# Patient Record
Sex: Female | Born: 1975 | Race: White | Hispanic: No | Marital: Married | Smoking: Former smoker
Health system: Southern US, Community
[De-identification: ages and names within clinical notes are randomized; demographics above are authoritative.]

## PROBLEM LIST (undated history)

## (undated) DIAGNOSIS — D649 Anemia, unspecified: Secondary | ICD-10-CM

## (undated) DIAGNOSIS — T8092XA Unspecified transfusion reaction, initial encounter: Secondary | ICD-10-CM

## (undated) DIAGNOSIS — O24319 Unspecified pre-existing diabetes mellitus in pregnancy, unspecified trimester: Secondary | ICD-10-CM

## (undated) DIAGNOSIS — E119 Type 2 diabetes mellitus without complications: Secondary | ICD-10-CM

## (undated) DIAGNOSIS — O24419 Gestational diabetes mellitus in pregnancy, unspecified control: Secondary | ICD-10-CM

## (undated) DIAGNOSIS — IMO0002 Reserved for concepts with insufficient information to code with codable children: Secondary | ICD-10-CM

## (undated) DIAGNOSIS — M5126 Other intervertebral disc displacement, lumbar region: Secondary | ICD-10-CM

## (undated) HISTORY — DX: Reserved for concepts with insufficient information to code with codable children: IMO0002

## (undated) HISTORY — DX: Anemia, unspecified: D64.9

## (undated) HISTORY — DX: Other intervertebral disc displacement, lumbar region: M51.26

## (undated) HISTORY — DX: Unspecified pre-existing diabetes mellitus in pregnancy, unspecified trimester: O24.319

## (undated) HISTORY — DX: Unspecified transfusion reaction, initial encounter: T80.92XA

## (undated) HISTORY — PX: LUMBAR DISC SURGERY: SHX700

## (undated) HISTORY — DX: Gestational diabetes mellitus in pregnancy, unspecified control: O24.419

## (undated) HISTORY — PX: DILATION AND CURETTAGE OF UTERUS: SHX78

---

## 2005-10-21 HISTORY — PX: LUMBAR LAMINECTOMY: SHX95

## 2010-04-09 ENCOUNTER — Ambulatory Visit: Payer: Self-pay | Admitting: Obstetrics & Gynecology

## 2010-05-18 ENCOUNTER — Other Ambulatory Visit: Admission: RE | Admit: 2010-05-18 | Discharge: 2010-05-18 | Payer: Self-pay | Admitting: Family

## 2010-05-18 ENCOUNTER — Ambulatory Visit: Payer: Self-pay | Admitting: Family

## 2010-05-18 LAB — CONVERTED CEMR LAB
HCT: 39.5 % (ref 36.0–46.0)
Hepatitis B Surface Ag: NEGATIVE
Lymphocytes Relative: 15 % (ref 12–46)
Lymphs Abs: 1.2 10*3/uL (ref 0.7–4.0)
Neutrophils Relative %: 79 % — ABNORMAL HIGH (ref 43–77)
Platelets: 178 10*3/uL (ref 150–400)
Rubella: 44.3 intl units/mL — ABNORMAL HIGH
WBC: 8.4 10*3/uL (ref 4.0–10.5)

## 2010-05-19 ENCOUNTER — Encounter: Payer: Self-pay | Admitting: Family

## 2010-05-23 ENCOUNTER — Ambulatory Visit (HOSPITAL_COMMUNITY): Admission: RE | Admit: 2010-05-23 | Discharge: 2010-05-23 | Payer: Self-pay | Admitting: Family Medicine

## 2010-05-30 ENCOUNTER — Encounter: Admission: RE | Admit: 2010-05-30 | Discharge: 2010-07-20 | Payer: Self-pay | Admitting: Obstetrics & Gynecology

## 2010-06-06 ENCOUNTER — Ambulatory Visit: Payer: Self-pay | Admitting: Obstetrics & Gynecology

## 2010-06-19 ENCOUNTER — Ambulatory Visit: Payer: Self-pay | Admitting: Obstetrics & Gynecology

## 2010-06-21 ENCOUNTER — Ambulatory Visit (HOSPITAL_COMMUNITY): Admission: RE | Admit: 2010-06-21 | Discharge: 2010-06-21 | Payer: Self-pay | Admitting: Obstetrics & Gynecology

## 2010-06-26 ENCOUNTER — Ambulatory Visit: Payer: Self-pay | Admitting: Obstetrics & Gynecology

## 2010-06-27 ENCOUNTER — Encounter: Payer: Self-pay | Admitting: Obstetrics & Gynecology

## 2010-06-27 LAB — CONVERTED CEMR LAB
ALT: 8 units/L (ref 0–35)
AST: 13 units/L (ref 0–37)
CO2: 20 meq/L (ref 19–32)
Creatinine 24 HR UR: 1889 mg/24hr — ABNORMAL HIGH (ref 700–1800)
Creatinine Clearance: 292 mL/min — ABNORMAL HIGH (ref 75–115)
Creatinine, Ser: 0.45 mg/dL (ref 0.40–1.20)
Creatinine, Urine: 94.5 mg/dL
HCT: 37.5 % (ref 36.0–46.0)
MCV: 90.8 fL (ref 78.0–100.0)
Platelets: 201 10*3/uL (ref 150–400)
RDW: 13.9 % (ref 11.5–15.5)
Total Bilirubin: 0.3 mg/dL (ref 0.3–1.2)
Uric Acid, Serum: 2.4 mg/dL (ref 2.4–7.0)

## 2010-07-02 ENCOUNTER — Ambulatory Visit: Payer: Self-pay | Admitting: Advanced Practice Midwife

## 2010-07-11 ENCOUNTER — Ambulatory Visit: Payer: Self-pay | Admitting: Obstetrics & Gynecology

## 2010-07-23 ENCOUNTER — Ambulatory Visit: Payer: Self-pay | Admitting: Physician Assistant

## 2010-08-02 ENCOUNTER — Ambulatory Visit (HOSPITAL_COMMUNITY): Admission: RE | Admit: 2010-08-02 | Discharge: 2010-08-02 | Payer: Self-pay | Admitting: Obstetrics & Gynecology

## 2010-08-20 ENCOUNTER — Ambulatory Visit: Payer: Self-pay | Admitting: Obstetrics & Gynecology

## 2010-08-23 ENCOUNTER — Ambulatory Visit: Payer: Self-pay | Admitting: Obstetrics & Gynecology

## 2010-08-27 ENCOUNTER — Ambulatory Visit: Payer: Self-pay | Admitting: Obstetrics and Gynecology

## 2010-08-28 ENCOUNTER — Ambulatory Visit: Payer: Self-pay | Admitting: Obstetrics & Gynecology

## 2010-08-30 ENCOUNTER — Ambulatory Visit (HOSPITAL_COMMUNITY): Admission: RE | Admit: 2010-08-30 | Discharge: 2010-08-30 | Payer: Self-pay | Admitting: Obstetrics & Gynecology

## 2010-09-03 ENCOUNTER — Ambulatory Visit: Payer: Self-pay | Admitting: Obstetrics and Gynecology

## 2010-09-05 ENCOUNTER — Ambulatory Visit: Payer: Self-pay | Admitting: Obstetrics & Gynecology

## 2010-09-11 ENCOUNTER — Ambulatory Visit: Payer: Self-pay | Admitting: Obstetrics & Gynecology

## 2010-09-16 ENCOUNTER — Inpatient Hospital Stay (HOSPITAL_COMMUNITY)
Admission: AD | Admit: 2010-09-16 | Discharge: 2010-09-17 | Payer: Self-pay | Source: Home / Self Care | Admitting: Obstetrics & Gynecology

## 2010-09-21 ENCOUNTER — Ambulatory Visit: Payer: Self-pay | Admitting: Obstetrics and Gynecology

## 2010-09-21 LAB — CONVERTED CEMR LAB: GC Probe Amp, Urine: NEGATIVE

## 2010-09-22 ENCOUNTER — Encounter: Payer: Self-pay | Admitting: Obstetrics and Gynecology

## 2010-09-25 ENCOUNTER — Ambulatory Visit: Payer: Self-pay | Admitting: Obstetrics & Gynecology

## 2010-09-26 ENCOUNTER — Encounter: Payer: Self-pay | Admitting: Obstetrics & Gynecology

## 2010-09-26 LAB — CONVERTED CEMR LAB
ALT: 20 units/L (ref 0–53)
AST: 16 units/L (ref 0–37)
Alkaline Phosphatase: 108 units/L (ref 39–117)
Calcium: 8.4 mg/dL (ref 8.4–10.5)
Chloride: 108 meq/L (ref 96–112)
Creatinine, Ser: 0.48 mg/dL (ref 0.40–1.50)
HCT: 37.2 % — ABNORMAL LOW (ref 39.0–52.0)
MCHC: 31.2 g/dL (ref 30.0–36.0)
Platelets: 194 10*3/uL (ref 150–400)
RDW: 15.4 % (ref 11.5–15.5)
Total Bilirubin: 0.2 mg/dL — ABNORMAL LOW (ref 0.3–1.2)

## 2010-09-27 ENCOUNTER — Ambulatory Visit (HOSPITAL_COMMUNITY)
Admission: RE | Admit: 2010-09-27 | Discharge: 2010-09-27 | Payer: Self-pay | Source: Home / Self Care | Attending: Obstetrics & Gynecology | Admitting: Obstetrics & Gynecology

## 2010-09-28 ENCOUNTER — Encounter: Payer: Self-pay | Admitting: Obstetrics & Gynecology

## 2010-09-28 ENCOUNTER — Ambulatory Visit: Payer: Self-pay | Admitting: Obstetrics and Gynecology

## 2010-09-28 LAB — CONVERTED CEMR LAB
Collection Interval-CRCL: 24 hr
Creatinine 24 HR UR: 1516 mg/24hr (ref 700–1800)
Creatinine Clearance: 199 mL/min — ABNORMAL HIGH (ref 75–115)
Creatinine, Urine: 46 mg/dL

## 2010-10-02 ENCOUNTER — Ambulatory Visit: Payer: Self-pay | Admitting: Obstetrics & Gynecology

## 2010-10-05 ENCOUNTER — Ambulatory Visit (HOSPITAL_COMMUNITY)
Admission: RE | Admit: 2010-10-05 | Discharge: 2010-10-05 | Payer: Self-pay | Source: Home / Self Care | Attending: Obstetrics & Gynecology | Admitting: Obstetrics & Gynecology

## 2010-10-09 ENCOUNTER — Ambulatory Visit: Payer: Self-pay | Admitting: Obstetrics & Gynecology

## 2010-10-09 ENCOUNTER — Inpatient Hospital Stay (HOSPITAL_COMMUNITY)
Admission: AD | Admit: 2010-10-09 | Discharge: 2010-10-09 | Payer: Self-pay | Source: Home / Self Care | Attending: Obstetrics & Gynecology | Admitting: Obstetrics & Gynecology

## 2010-10-11 ENCOUNTER — Inpatient Hospital Stay (HOSPITAL_COMMUNITY)
Admission: AD | Admit: 2010-10-11 | Discharge: 2010-10-13 | Payer: Self-pay | Source: Home / Self Care | Attending: Obstetrics & Gynecology | Admitting: Obstetrics & Gynecology

## 2010-11-10 ENCOUNTER — Encounter: Payer: Self-pay | Admitting: Obstetrics & Gynecology

## 2010-12-04 ENCOUNTER — Ambulatory Visit: Payer: Medicaid Other | Admitting: Obstetrics & Gynecology

## 2010-12-04 DIAGNOSIS — O99815 Abnormal glucose complicating the puerperium: Secondary | ICD-10-CM

## 2010-12-18 ENCOUNTER — Encounter: Payer: Self-pay | Admitting: Obstetrics & Gynecology

## 2010-12-18 LAB — CONVERTED CEMR LAB: Glucose, Fasting: 114 mg/dL — ABNORMAL HIGH (ref 70–99)

## 2010-12-31 LAB — COMPREHENSIVE METABOLIC PANEL
ALT: 17 U/L (ref 0–35)
ALT: 20 U/L (ref 0–35)
AST: 18 U/L (ref 0–37)
Albumin: 2.6 g/dL — ABNORMAL LOW (ref 3.5–5.2)
Alkaline Phosphatase: 93 U/L (ref 39–117)
CO2: 21 mEq/L (ref 19–32)
Calcium: 9.7 mg/dL (ref 8.4–10.5)
Chloride: 102 mEq/L (ref 96–112)
Creatinine, Ser: 0.62 mg/dL (ref 0.4–1.2)
GFR calc Af Amer: >60 mL/min (ref >60)
GFR calc Af Amer: >60 mL/min (ref >60)
GFR calc non Af Amer: >60 mL/min (ref >60)
Glucose, Bld: 68 mg/dL — ABNORMAL LOW (ref 70–99)
Potassium: 3.8 mEq/L (ref 3.5–5.1)
Sodium: 133 mEq/L — ABNORMAL LOW (ref 135–145)
Sodium: 135 mEq/L (ref 135–145)
Total Bilirubin: 0.5 mg/dL (ref 0.3–1.2)
Total Protein: 6 g/dL (ref 6.0–8.3)
Total Protein: 6.6 g/dL (ref 6.0–8.3)

## 2010-12-31 LAB — CBC
HCT: 35.4 % — ABNORMAL LOW (ref 36.0–46.0)
Hemoglobin: 11.7 g/dL — ABNORMAL LOW (ref 12.0–15.0)
Hemoglobin: 12 g/dL (ref 12.0–15.0)
MCH: 28.3 pg (ref 26.0–34.0)
MCHC: 32.6 g/dL (ref 30.0–36.0)
MCV: 86.8 fL (ref 78.0–100.0)
RDW: 15.1 % (ref 11.5–15.5)
WBC: 10.3 10*3/uL (ref 4.0–10.5)

## 2010-12-31 LAB — URINALYSIS, ROUTINE W REFLEX MICROSCOPIC
Bilirubin Urine: NEGATIVE
Glucose, UA: NEGATIVE mg/dL
Ketones, ur: NEGATIVE mg/dL
Protein, ur: NEGATIVE mg/dL
pH: 5.5 (ref 5.0–8.0)

## 2010-12-31 LAB — RPR: RPR Ser Ql: NONREACTIVE

## 2010-12-31 LAB — GLUCOSE, CAPILLARY
Glucose-Capillary: 63 mg/dL — ABNORMAL LOW (ref 70–99)
Glucose-Capillary: 93 mg/dL (ref 70–99)

## 2010-12-31 LAB — PROTEIN / CREATININE RATIO, URINE
Creatinine, Urine: 40.8 mg/dL
Total Protein, Urine: <6 mg/dL

## 2010-12-31 LAB — URINE MICROSCOPIC-ADD ON

## 2011-07-29 LAB — OB RESULTS CONSOLE GC/CHLAMYDIA: Chlamydia: NEGATIVE

## 2011-09-17 ENCOUNTER — Ambulatory Visit (INDEPENDENT_AMBULATORY_CARE_PROVIDER_SITE_OTHER): Payer: Medicaid Other | Admitting: Obstetrics & Gynecology

## 2011-09-17 ENCOUNTER — Encounter: Payer: Self-pay | Admitting: Obstetrics & Gynecology

## 2011-09-17 ENCOUNTER — Other Ambulatory Visit: Payer: Self-pay | Admitting: Obstetrics & Gynecology

## 2011-09-17 VITALS — BP 118/80 | Temp 97.3°F | Ht 66.0 in | Wt 244.0 lb

## 2011-09-17 DIAGNOSIS — Z3480 Encounter for supervision of other normal pregnancy, unspecified trimester: Secondary | ICD-10-CM

## 2011-09-17 DIAGNOSIS — O24419 Gestational diabetes mellitus in pregnancy, unspecified control: Secondary | ICD-10-CM

## 2011-09-17 DIAGNOSIS — O99810 Abnormal glucose complicating pregnancy: Secondary | ICD-10-CM

## 2011-09-17 DIAGNOSIS — Z3689 Encounter for other specified antenatal screening: Secondary | ICD-10-CM

## 2011-09-17 NOTE — Progress Notes (Signed)
She is here for a NOB. Her visit was uncomplicated. I advised her about weight gain.

## 2011-09-17 NOTE — Patient Instructions (Signed)
Pregnancy - First Trimester During sexual intercourse, millions of sperm go into the vagina. Only 1 sperm will penetrate and fertilize the female egg while it is in the Fallopian tube. One week later, the fertilized egg implants into the wall of the uterus. An embryo begins to develop into a baby. At 6 to 8 weeks, the eyes and face are formed and the heartbeat can be seen on ultrasound. At the end of 12 weeks (first trimester), all the baby's organs are formed. Now that you are pregnant, you will want to do everything you can to have a healthy baby. Two of the most important things are to get good prenatal care and follow your caregiver's instructions. Prenatal care is all the medical care you receive before the baby's birth. It is given to prevent, find, and treat problems during the pregnancy and childbirth. PRENATAL EXAMS  During prenatal visits, your weight, blood pressure and urine are checked. This is done to make sure you are healthy and progressing normally during the pregnancy.   A pregnant woman should gain 25 to 35 pounds during the pregnancy. However, if you are over weight or underweight, your caregiver will advise you regarding your weight.   Your caregiver will ask and answer questions for you.   Blood work, cervical cultures, other necessary tests and a Pap test are done during your prenatal exams. These tests are done to check on your health and the probable health of your baby. Tests are strongly recommended and done for HIV with your permission. This is the virus that causes AIDS. These tests are done because medications can be given to help prevent your baby from being born with this infection should you have been infected without knowing it. Blood work is also used to find out your blood type, previous infections and follow your blood levels (hemoglobin).   Low hemoglobin (anemia) is common during pregnancy. Iron and vitamins are given to help prevent this. Later in the pregnancy,  blood tests for diabetes will be done along with any other tests if any problems develop. You may need tests to make sure you and the baby are doing well.   You may need other tests to make sure you and the baby are doing well.  CHANGES DURING THE FIRST TRIMESTER (THE FIRST 3 MONTHS OF PREGNANCY) Your body goes through many changes during pregnancy. They vary from person to person. Talk to your caregiver about changes you notice and are concerned about. Changes can include:  Your menstrual period stops.   The egg and sperm carry the genes that determine what you look like. Genes from you and your partner are forming a baby. The female genes determine whether the baby is a boy or a girl.   Your body increases in girth and you may feel bloated.   Feeling sick to your stomach (nauseous) and throwing up (vomiting). If the vomiting is uncontrollable, call your caregiver.   Your breasts will begin to enlarge and become tender.   Your nipples may stick out more and become darker.   The need to urinate more. Painful urination may mean you have a bladder infection.   Tiring easily.   Loss of appetite.   Cravings for certain kinds of food.   At first, you may gain or lose a couple of pounds.   You may have changes in your emotions from day to day (excited to be pregnant or concerned something may go wrong with the pregnancy and baby).     You may have more vivid and strange dreams.  HOME CARE INSTRUCTIONS   It is very important to avoid all smoking, alcohol and un-prescribed drugs during your pregnancy. These affect the formation and growth of the baby. Avoid chemicals while pregnant to ensure the delivery of a healthy infant.   Start your prenatal visits by the 12th week of pregnancy. They are usually scheduled monthly at first, then more often in the last 2 months before delivery. Keep your caregiver's appointments. Follow your caregiver's instructions regarding medication use, blood and lab  tests, exercise, and diet.   During pregnancy, you are providing food for you and your baby. Eat regular, well-balanced meals. Choose foods such as meat, fish, milk and other low fat dairy products, vegetables, fruits, and whole-grain breads and cereals. Your caregiver will tell you of the ideal weight gain.   You can help morning sickness by keeping soda crackers at the bedside. Eat a couple before arising in the morning. You may want to use the crackers without salt on them.   Eating 4 to 5 small meals rather than 3 large meals a day also may help the nausea and vomiting.   Drinking liquids between meals instead of during meals also seems to help nausea and vomiting.   A physical sexual relationship may be continued throughout pregnancy if there are no other problems. Problems may be early (premature) leaking of amniotic fluid from the membranes, vaginal bleeding, or belly (abdominal) pain.   Exercise regularly if there are no restrictions. Check with your caregiver or physical therapist if you are unsure of the safety of some of your exercises. Greater weight gain will occur in the last 2 trimesters of pregnancy. Exercising will help:   Control your weight.   Keep you in shape.   Prepare you for labor and delivery.   Help you lose your pregnancy weight after you deliver your baby.   Wear a good support or jogging bra for breast tenderness during pregnancy. This may help if worn during sleep too.   Ask when prenatal classes are available. Begin classes when they are offered.   Do not use hot tubs, steam rooms or saunas.   Wear your seat belt when driving. This protects you and your baby if you are in an accident.   Avoid raw meat, uncooked cheese, cat litter boxes and soil used by cats throughout the pregnancy. These carry germs that can cause birth defects in the baby.   The first trimester is a good time to visit your dentist for your dental health. Getting your teeth cleaned is  OK. Use a softer toothbrush and brush gently during pregnancy.   Ask for help if you have financial, counseling or nutritional needs during pregnancy. Your caregiver will be able to offer counseling for these needs as well as refer you for other special needs.   Do not take any medications or herbs unless told by your caregiver.   Inform your caregiver if there is any mental or physical domestic violence.   Make a list of emergency phone numbers of family, friends, hospital, and police and fire departments.   Write down your questions. Take them to your prenatal visit.   Do not douche.   Do not cross your legs.   If you have to stand for long periods of time, rotate you feet or take small steps in a circle.   You may have more vaginal secretions that may require a sanitary pad. Do not use   tampons or scented sanitary pads.  MEDICATIONS AND DRUG USE IN PREGNANCY  Take prenatal vitamins as directed. The vitamin should contain 1 milligram of folic acid. Keep all vitamins out of reach of children. Only a couple vitamins or tablets containing iron may be fatal to a baby or young child when ingested.   Avoid use of all medications, including herbs, over-the-counter medications, not prescribed or suggested by your caregiver. Only take over-the-counter or prescription medicines for pain, discomfort, or fever as directed by your caregiver. Do not use aspirin, ibuprofen, or naproxen unless directed by your caregiver.   Let your caregiver also know about herbs you may be using.   Alcohol is related to a number of birth defects. This includes fetal alcohol syndrome. All alcohol, in any form, should be avoided completely. Smoking will cause low birth rate and premature babies.   Street or illegal drugs are very harmful to the baby. They are absolutely forbidden. A baby born to an addicted mother will be addicted at birth. The baby will go through the same withdrawal an adult does.   Let your  caregiver know about any medications that you have to take and for what reason you take them.  MISCARRIAGE IS COMMON DURING PREGNANCY A miscarriage does not mean you did something wrong. It is not a reason to worry about getting pregnant again. Your caregiver will help you with questions you may have. If you have a miscarriage, you may need minor surgery. SEEK MEDICAL CARE IF:  You have any concerns or worries during your pregnancy. It is better to call with your questions if you feel they cannot wait, rather than worry about them. SEEK IMMEDIATE MEDICAL CARE IF:   An unexplained oral temperature above 102 F (38.9 C) develops, or as your caregiver suggests.   You have leaking of fluid from the vagina (birth canal). If leaking membranes are suspected, take your temperature and inform your caregiver of this when you call.   There is vaginal spotting or bleeding. Notify your caregiver of the amount and how many pads are used.   You develop a bad smelling vaginal discharge with a change in the color.   You continue to feel sick to your stomach (nauseated) and have no relief from remedies suggested. You vomit blood or coffee ground-like materials.   You lose more than 2 pounds of weight in 1 week.   You gain more than 2 pounds of weight in 1 week and you notice swelling of your face, hands, feet, or legs.   You gain 5 pounds or more in 1 week (even if you do not have swelling of your hands, face, legs, or feet).   You get exposed to German measles and have never had them.   You are exposed to fifth disease or chickenpox.   You develop belly (abdominal) pain. Round ligament discomfort is a common non-cancerous (benign) cause of abdominal pain in pregnancy. Your caregiver still must evaluate this.   You develop headache, fever, diarrhea, pain with urination, or shortness of breath.   You fall or are in a car accident or have any kind of trauma.   There is mental or physical violence in  your home.  Document Released: 10/01/2001 Document Revised: 06/19/2011 Document Reviewed: 04/04/2009 [employer] Patient Information 2012 [employer]. 

## 2011-09-19 ENCOUNTER — Telehealth: Payer: Self-pay | Admitting: *Deleted

## 2011-09-19 LAB — HIV ANTIBODY (ROUTINE TESTING W REFLEX): HIV: NONREACTIVE

## 2011-09-19 LAB — OBSTETRIC PANEL
Antibody Screen: NEGATIVE
Basophils Absolute: 0 10*3/uL (ref 0.0–0.1)
Eosinophils Relative: 2 % (ref 0–5)
HCT: 40.1 % (ref 36.0–46.0)
Hemoglobin: 13.3 g/dL (ref 12.0–15.0)
Lymphocytes Relative: 15 % (ref 12–46)
MCHC: 33.2 g/dL (ref 30.0–36.0)
MCV: 91.8 fL (ref 78.0–100.0)
Monocytes Absolute: 0.4 10*3/uL (ref 0.1–1.0)
Monocytes Relative: 5 % (ref 3–12)
Neutro Abs: 7.7 10*3/uL (ref 1.7–7.7)
RDW: 13.5 % (ref 11.5–15.5)
Rh Type: POSITIVE
Rubella: 56.2 IU/mL — ABNORMAL HIGH
WBC: 9.8 10*3/uL (ref 4.0–10.5)

## 2011-09-19 LAB — GC/CHLAMYDIA PROBE AMP, URINE: GC Probe Amp, Urine: NEGATIVE

## 2011-09-19 LAB — GLUCOSE TOLERANCE, 3 HOURS
Glucose Tolerance, Fasting: 165 mg/dL — ABNORMAL HIGH (ref 70–104)
Glucose, GTT - 3 Hour: 195 mg/dL — ABNORMAL HIGH (ref 70–144)

## 2011-09-19 NOTE — Telephone Encounter (Signed)
Pt notified of elevated 3 hr GTT.  Spoke with Dr Marice Potter who wanted pt seen by MD next week to initiate insulin.

## 2011-09-20 LAB — URINE CULTURE: Colony Count: 15000

## 2011-09-25 ENCOUNTER — Ambulatory Visit (HOSPITAL_COMMUNITY)
Admission: RE | Admit: 2011-09-25 | Discharge: 2011-09-25 | Disposition: A | Discharge status: Home / Self Care | Payer: Medicaid Other | Source: Ambulatory Visit | Attending: Obstetrics & Gynecology | Admitting: Obstetrics & Gynecology

## 2011-09-25 ENCOUNTER — Other Ambulatory Visit: Payer: Self-pay | Admitting: Obstetrics & Gynecology

## 2011-09-25 ENCOUNTER — Ambulatory Visit (INDEPENDENT_AMBULATORY_CARE_PROVIDER_SITE_OTHER): Payer: Medicaid Other | Admitting: Obstetrics & Gynecology

## 2011-09-25 VITALS — BP 143/73 | Temp 97.3°F | Wt 242.0 lb

## 2011-09-25 DIAGNOSIS — O0990 Supervision of high risk pregnancy, unspecified, unspecified trimester: Secondary | ICD-10-CM | POA: Insufficient documentation

## 2011-09-25 DIAGNOSIS — Z3480 Encounter for supervision of other normal pregnancy, unspecified trimester: Secondary | ICD-10-CM

## 2011-09-25 DIAGNOSIS — O99810 Abnormal glucose complicating pregnancy: Secondary | ICD-10-CM | POA: Insufficient documentation

## 2011-09-25 DIAGNOSIS — O09299 Supervision of pregnancy with other poor reproductive or obstetric history, unspecified trimester: Secondary | ICD-10-CM | POA: Insufficient documentation

## 2011-09-25 DIAGNOSIS — O09529 Supervision of elderly multigravida, unspecified trimester: Secondary | ICD-10-CM | POA: Insufficient documentation

## 2011-09-25 DIAGNOSIS — O24419 Gestational diabetes mellitus in pregnancy, unspecified control: Secondary | ICD-10-CM

## 2011-09-25 DIAGNOSIS — Z3689 Encounter for other specified antenatal screening: Secondary | ICD-10-CM

## 2011-09-25 DIAGNOSIS — J45909 Unspecified asthma, uncomplicated: Secondary | ICD-10-CM | POA: Insufficient documentation

## 2011-09-25 MED ORDER — GLYBURIDE 5 MG PO TABS: 5.0000 mg | ORAL_TABLET | Freq: Every day | ORAL | Status: DC

## 2011-09-25 NOTE — Progress Notes (Signed)
Fasting 164, 166;  2 hr pp 219,  2 hr lunch 108,  2 hr dinner 181.  Will start glyburide 5 mg bid.  Pt will call with CBGs on Monday.  24 hour urine and baseline CMP.  Need dating [place] ASAP.  Pt familiar with diabetic diet and will follow it.

## 2011-09-25 NOTE — Progress Notes (Signed)
P = 91 

## 2011-09-25 NOTE — Progress Notes (Signed)
Urine- moderate ketones

## 2011-09-26 LAB — COMPREHENSIVE METABOLIC PANEL
ALT: 19 U/L (ref 0–35)
CO2: 18 mEq/L — ABNORMAL LOW (ref 19–32)
Calcium: 9 mg/dL (ref 8.4–10.5)
Chloride: 103 mEq/L (ref 96–112)
Creat: 0.5 mg/dL (ref 0.50–1.10)
Glucose, Bld: 123 mg/dL — ABNORMAL HIGH (ref 70–99)
Total Protein: 6.3 g/dL (ref 6.0–8.3)

## 2011-09-30 ENCOUNTER — Telehealth: Payer: Self-pay | Admitting: Obstetrics & Gynecology

## 2011-09-30 ENCOUNTER — Telehealth: Payer: Self-pay | Admitting: *Deleted

## 2011-09-30 MED ORDER — INSULIN REGULAR HUMAN 100 UNIT/ML IJ SOLN: 19.0000 [IU] | Freq: Every day | INTRAMUSCULAR | Status: DC

## 2011-09-30 MED ORDER — INSULIN REGULAR HUMAN 100 UNIT/ML IJ SOLN: 14.0000 [IU] | Freq: Every day | INTRAMUSCULAR | Status: DC

## 2011-09-30 MED ORDER — SYRINGE (DISPOSABLE) 1 ML MISC: 1.0000 | Freq: Three times a day (TID) | Status: DC

## 2011-09-30 MED ORDER — INSULIN NPH (HUMAN) (ISOPHANE) 100 UNIT/ML ~~LOC~~ SUSP: 14.0000 [IU] | Freq: Every day | SUBCUTANEOUS | Status: DC

## 2011-09-30 MED ORDER — INSULIN NPH (HUMAN) (ISOPHANE) 100 UNIT/ML ~~LOC~~ SUSP: 28.0000 [IU] | Freq: Every day | SUBCUTANEOUS | Status: DC

## 2011-09-30 NOTE — Telephone Encounter (Signed)
3:30pm Pt called and given instructions on starting her insulin.  Once pt has once dose of insulin she may stop Glyburide per Dr. Penne Lash.  Pt to call and give report of CBG on Thursday.  Pt decline genetic screening of AFP.  Pt also states that she is aggravated because she wanted to just add Metformin to her current RX of Glyburide. 4:15 Addendum to previous above note.  Pt called back and said that her Medicaid is not active yet and she cannot afford her Insulin.  Spoke with Dr. Penne Lash and pt is to continue Glyburide and start Metformin 100mg  BID.  I will talk with Arnetha Courser  about financial assist to help pt with medication.  Pt was made aware that this glucose does need to get under control and she must be compliant because she is at high risk for a fetal death due to hyperglycemia.  Pt voices understanding.

## 2011-09-30 NOTE — Telephone Encounter (Signed)
Glyburide not controlling hyperglycemia.  Will change to weight based insulin.  Pt is 109 kg, will start at 80% NPH qam = 28 units Regular qam  = 19 units Regular before dinner = 14 units NPH QHS = 14 units  Pt to come in Wednesday with CBGs or can call with CBGs . If pt calls, then need to offer genetic screening.  Dating [place] put her at 17 weeks last week.

## 2011-10-01 ENCOUNTER — Ambulatory Visit (HOSPITAL_COMMUNITY): Payer: Medicaid Other

## 2011-10-03 ENCOUNTER — Telehealth: Payer: Self-pay | Admitting: *Deleted

## 2011-10-03 NOTE — Telephone Encounter (Signed)
Pt called with her CBG levels for 12/11-this AM. 12/11 F-148  PC BKFT 171   Lunch 105    Dinner 136  12/12 F 139                  129              111                141  12/13 F 125  Pt also found some Humulin N and Reg insulin in her refrigerator that has not expired and she is going to start her insulin tomorrow.

## 2011-10-04 ENCOUNTER — Other Ambulatory Visit (INDEPENDENT_AMBULATORY_CARE_PROVIDER_SITE_OTHER): Payer: Medicaid Other

## 2011-10-04 DIAGNOSIS — O24919 Unspecified diabetes mellitus in pregnancy, unspecified trimester: Secondary | ICD-10-CM

## 2011-10-04 LAB — CBC
HCT: 38.9 % (ref 36.0–46.0)
MCHC: 34.2 g/dL (ref 30.0–36.0)
Platelets: 182 10*3/uL (ref 150–400)
RDW: 13.6 % (ref 11.5–15.5)
WBC: 11.1 10*3/uL — ABNORMAL HIGH (ref 4.0–10.5)

## 2011-10-05 LAB — COMPREHENSIVE METABOLIC PANEL
ALT: 21 U/L (ref 0–35)
AST: 29 U/L (ref 0–37)
Albumin: 3.3 g/dL — ABNORMAL LOW (ref 3.5–5.2)
CO2: 20 mEq/L (ref 19–32)
Calcium: 8.9 mg/dL (ref 8.4–10.5)
Chloride: 104 mEq/L (ref 96–112)
Potassium: 4 mEq/L (ref 3.5–5.3)
Total Protein: 5.6 g/dL — ABNORMAL LOW (ref 6.0–8.3)

## 2011-10-05 LAB — PROTEIN, URINE, 24 HOUR: Protein, 24H Urine: 120 mg/d — ABNORMAL HIGH (ref 50–100)

## 2011-10-05 LAB — CREATININE CLEARANCE, URINE, 24 HOUR: Creatinine Clearance: 262 mL/min — ABNORMAL HIGH (ref 75–115)

## 2011-10-07 ENCOUNTER — Telehealth: Payer: Self-pay | Admitting: *Deleted

## 2011-10-07 NOTE — Telephone Encounter (Signed)
Pt called stating she had a toothache and wanted to see what else she could use for pain.  States her Medicaid hasn't come through and she doesn't have a dentist.  Spoke with Dr. Debroah Loop and he said that she needed to be seen by a dentist that he could not just call in for meds without being seen.  Notified pt to call the emergency dentist.

## 2011-10-22 NOTE — L&D Delivery Note (Signed)
Operative Delivery Note At 4:00 PM a viable female was delivered via Vaginal, Vacuum (Extractor) (Presentation: Left Occiput Anterior) due to terminal bradycardia.  The patient was examined and found to be Presentation: vertex; Position: Left,, Occiput,, Anterior; Station: +3.  Verbal consent: obtained from patient.  Risks and benefits discussed in detail.  Risks include, but are not limited to the risks of anesthesia, bleeding, infection, damage to maternal tissues, fetal cephalhematoma.  There is also the risk of inability to effect vaginal delivery of the head, or shoulder dystocia that cannot be resolved by established maneuvers, leading to the need for emergency cesarean section.  The Kiwi Omnicup was positioned over the sagittal suture 3 cm anterior to posterior fontanelle.  Pressure was then increased to 500 mmHg, and the patient was instructed to push.  Pulling was administered along the pelvic curve.  1 pull was administered during 1 contractions, with release of pressure between contractions.  No popoffs.  The infant was then delivered atraumatically.  Sponge, instrument and needle counts were correct x2.  APGAR: 9, 9; weight 7 lb 5.8 oz (3340 g).   Placenta status: Intact, Spontaneous.  Cord: 3 vessels with the following complications: Knot.  Anesthesia: Epidural  Episiotomy: None Lacerations: None Est. Blood Loss (mL): 300  Mom to postpartum.  Baby to nursery-stable.  Ahni Bradwell JEHIEL 02/26/2012, 5:18 PM

## 2011-11-08 ENCOUNTER — Encounter: Payer: Self-pay | Admitting: Family

## 2011-11-08 ENCOUNTER — Ambulatory Visit (INDEPENDENT_AMBULATORY_CARE_PROVIDER_SITE_OTHER): Payer: Self-pay | Admitting: Family

## 2011-11-08 VITALS — BP 118/70 | Temp 98.2°F | Wt 242.0 lb

## 2011-11-08 DIAGNOSIS — O24419 Gestational diabetes mellitus in pregnancy, unspecified control: Secondary | ICD-10-CM

## 2011-11-08 DIAGNOSIS — Z3480 Encounter for supervision of other normal pregnancy, unspecified trimester: Secondary | ICD-10-CM

## 2011-11-08 DIAGNOSIS — O99810 Abnormal glucose complicating pregnancy: Secondary | ICD-10-CM

## 2011-11-08 LAB — OB RESULTS CONSOLE RPR: RPR: NONREACTIVE

## 2011-11-08 LAB — OB RESULTS CONSOLE HIV ANTIBODY (ROUTINE TESTING): HIV: NONREACTIVE

## 2011-11-08 MED ORDER — GLUCOSE BLOOD VI STRP: ORAL_STRIP | Status: DC

## 2011-11-08 NOTE — Progress Notes (Signed)
Ran out of insulin and has stopped carbs because she doesn't have insurance coverage for her meds.  CBG 162 1 hr post bkfst  Ketones 80

## 2011-11-08 NOTE — Progress Notes (Signed)
Pt here with reports of only taking Metformin 1000mg  BID; states that she was using her old insulin 4 weeks ago, but ran out.  Does not have CBG log with her today, states "everything has been normal"; +fetal movement.  Also noted that some prenatal labs and pap/GC/CT is missing; pt would like to defer pap w/GC/CT for next visit.  Obtain HIV, RPR, CBC today. Schedule follow-up growth [place] and schedule fetal echo.  Consulted with Dr. Debroah Loop who agrees with plan.  Pt will return in one week with CBG log and adjustments will be made at that time.  Emphasized importance of documentation of blood sugars and bringing them to appointment for health of baby.  Pt at conclusion of appointment states her Medicaid is not complete and can't afford strips; will [facility].

## 2011-11-09 LAB — CBC
Hemoglobin: 13 g/dL (ref 12.0–15.0)
MCH: 30.2 pg (ref 26.0–34.0)
Platelets: 183 10*3/uL (ref 150–400)
RBC: 4.31 MIL/uL (ref 3.87–5.11)
WBC: 9.7 10*3/uL (ref 4.0–10.5)

## 2011-11-09 LAB — RPR

## 2011-11-09 LAB — HIV ANTIBODY (ROUTINE TESTING W REFLEX): HIV: NONREACTIVE

## 2011-11-15 ENCOUNTER — Encounter: Payer: Self-pay | Admitting: Family

## 2011-11-27 ENCOUNTER — Other Ambulatory Visit: Payer: Self-pay | Admitting: Obstetrics & Gynecology

## 2011-11-29 ENCOUNTER — Ambulatory Visit (HOSPITAL_COMMUNITY)
Admission: RE | Admit: 2011-11-29 | Discharge: 2011-11-29 | Disposition: A | Discharge status: Home / Self Care | Payer: Medicaid Other | Source: Ambulatory Visit | Attending: Family | Admitting: Family

## 2011-11-29 DIAGNOSIS — O09299 Supervision of pregnancy with other poor reproductive or obstetric history, unspecified trimester: Secondary | ICD-10-CM | POA: Insufficient documentation

## 2011-11-29 DIAGNOSIS — O24419 Gestational diabetes mellitus in pregnancy, unspecified control: Secondary | ICD-10-CM

## 2011-11-29 DIAGNOSIS — O09529 Supervision of elderly multigravida, unspecified trimester: Secondary | ICD-10-CM | POA: Insufficient documentation

## 2011-11-29 DIAGNOSIS — O99810 Abnormal glucose complicating pregnancy: Secondary | ICD-10-CM | POA: Insufficient documentation

## 2011-12-06 ENCOUNTER — Ambulatory Visit (INDEPENDENT_AMBULATORY_CARE_PROVIDER_SITE_OTHER): Payer: Medicaid Other | Admitting: Advanced Practice Midwife

## 2011-12-06 DIAGNOSIS — O0990 Supervision of high risk pregnancy, unspecified, unspecified trimester: Secondary | ICD-10-CM

## 2011-12-06 DIAGNOSIS — O24919 Unspecified diabetes mellitus in pregnancy, unspecified trimester: Secondary | ICD-10-CM

## 2011-12-06 NOTE — Patient Instructions (Signed)
Pregnancy - Third Trimester The third trimester of pregnancy (the last 3 months) is a period of the most rapid growth for you and your baby. The baby approaches a length of 20 inches and a weight of 6 to 10 pounds. The baby is adding on fat and getting ready for life outside your body. While inside, babies have periods of sleeping and waking, suck their thumbs, and hiccups. You can often feel small contractions of the uterus. This is false labor. It is also called Braxton-Hicks contractions. This is like a practice for labor. The usual problems in this stage of pregnancy include more difficulty breathing, swelling of the hands and feet from water retention, and having to urinate more often because of the uterus and baby pressing on your bladder.  PRENATAL EXAMS  Blood work may continue to be done during prenatal exams. These tests are done to check on your health and the probable health of your baby. Blood work is used to follow your blood levels (hemoglobin). Anemia (low hemoglobin) is common during pregnancy. Iron and vitamins are given to help prevent this. You may also continue to be checked for diabetes. Some of the past blood tests may be done again.   The size of the uterus is measured during each visit. This makes sure your baby is growing properly according to your pregnancy dates.   Your blood pressure is checked every prenatal visit. This is to make sure you are not getting toxemia.   Your urine is checked every prenatal visit for infection, diabetes and protein.   Your weight is checked at each visit. This is done to make sure gains are happening at the suggested rate and that you and your baby are growing normally.   Sometimes, an ultrasound is performed to confirm the position and the proper growth and development of the baby. This is a test done that bounces harmless sound waves off the baby so your caregiver can more accurately determine due dates.   Discuss the type of pain  medication and anesthesia you will have during your labor and delivery.   Discuss the possibility and anesthesia if a Cesarean Section might be necessary.   Inform your caregiver if there is any mental or physical violence at home.  Sometimes, a specialized non-stress test, contraction stress test and biophysical profile are done to make sure the baby is not having a problem. Checking the amniotic fluid surrounding the baby is called an amniocentesis. The amniotic fluid is removed by sticking a needle into the belly (abdomen). This is sometimes done near the end of pregnancy if an early delivery is required. In this case, it is done to help make sure the baby's lungs are mature enough for the baby to live outside of the womb. If the lungs are not mature and it is unsafe to deliver the baby, an injection of cortisone medication is given to the mother 1 to 2 days before the delivery. This helps the baby's lungs mature and makes it safer to deliver the baby. CHANGES OCCURING IN THE THIRD TRIMESTER OF PREGNANCY Your body goes through many changes during pregnancy. They vary from person to person. Talk to your caregiver about changes you notice and are concerned about.  During the last trimester, you have probably had an increase in your appetite. It is normal to have cravings for certain foods. This varies from person to person and pregnancy to pregnancy.   You may begin to get stretch marks on your hips,   abdomen, and breasts. These are normal changes in the body during pregnancy. There are no exercises or medications to take which prevent this change.   Constipation may be treated with a stool softener or adding bulk to your diet. Drinking lots of fluids, fiber in vegetables, fruits, and whole grains are helpful.   Exercising is also helpful. If you have been very active up until your pregnancy, most of these activities can be continued during your pregnancy. If you have been less active, it is helpful  to start an exercise program such as walking. Consult your caregiver before starting exercise programs.   Avoid all smoking, alcohol, un-prescribed drugs, herbs and "street drugs" during your pregnancy. These chemicals affect the formation and growth of the baby. Avoid chemicals throughout the pregnancy to ensure the delivery of a healthy infant.   Backache, varicose veins and hemorrhoids may develop or get worse.   You will tire more easily in the third trimester, which is normal.   The baby's movements may be stronger and more often.   You may become short of breath easily.   Your belly button may stick out.   A yellow discharge may leak from your breasts called colostrum.   You may have a bloody mucus discharge. This usually occurs a few days to a week before labor begins.  HOME CARE INSTRUCTIONS   Keep your caregiver's appointments. Follow your caregiver's instructions regarding medication use, exercise, and diet.   During pregnancy, you are providing food for you and your baby. Continue to eat regular, well-balanced meals. Choose foods such as meat, fish, milk and other low fat dairy products, vegetables, fruits, and whole-grain breads and cereals. Your caregiver will tell you of the ideal weight gain.   A physical sexual relationship may be continued throughout pregnancy if there are no other problems such as early (premature) leaking of amniotic fluid from the membranes, vaginal bleeding, or belly (abdominal) pain.   Exercise regularly if there are no restrictions. Check with your caregiver if you are unsure of the safety of your exercises. Greater weight gain will occur in the last 2 trimesters of pregnancy. Exercising helps:   Control your weight.   Get you in shape for labor and delivery.   You lose weight after you deliver.   Rest a lot with legs elevated, or as needed for leg cramps or low back pain.   Wear a good support or jogging bra for breast tenderness during  pregnancy. This may help if worn during sleep. Pads or tissues may be used in the bra if you are leaking colostrum.   Do not use hot tubs, steam rooms, or saunas.   Wear your seat belt when driving. This protects you and your baby if you are in an accident.   Avoid raw meat, cat litter boxes and soil used by cats. These carry germs that can cause birth defects in the baby.   It is easier to loose urine during pregnancy. Tightening up and strengthening the pelvic muscles will help with this problem. You can practice stopping your urination while you are going to the bathroom. These are the same muscles you need to strengthen. It is also the muscles you would use if you were trying to stop from passing gas. You can practice tightening these muscles up 10 times a set and repeating this about 3 times per day. Once you know what muscles to tighten up, do not perform these exercises during urination. It is more likely   to cause an infection by backing up the urine.   Ask for help if you have financial, counseling or nutritional needs during pregnancy. Your caregiver will be able to offer counseling for these needs as well as refer you for other special needs.   Make a list of emergency phone numbers and have them available.   Plan on getting help from family or friends when you go home from the hospital.   Make a trial run to the hospital.   Take prenatal classes with the father to understand, practice and ask questions about the labor and delivery.   Prepare the baby's room/nursery.   Do not travel out of the city unless it is absolutely necessary and with the advice of your caregiver.   Wear only low or no heal shoes to have better balance and prevent falling.  MEDICATIONS AND DRUG USE IN PREGNANCY  Take prenatal vitamins as directed. The vitamin should contain 1 milligram of folic acid. Keep all vitamins out of reach of children. Only a couple vitamins or tablets containing iron may be fatal  to a baby or young child when ingested.   Avoid use of all medications, including herbs, over-the-counter medications, not prescribed or suggested by your caregiver. Only take over-the-counter or prescription medicines for pain, discomfort, or fever as directed by your caregiver. Do not use aspirin, ibuprofen (Motrin, Advil, Nuprin) or naproxen (Aleve) unless OK'd by your caregiver.   Let your caregiver also know about herbs you may be using.   Alcohol is related to a number of birth defects. This includes fetal alcohol syndrome. All alcohol, in any form, should be avoided completely. Smoking will cause low birth rate and premature babies.   Street/illegal drugs are very harmful to the baby. They are absolutely forbidden. A baby born to an addicted mother will be addicted at birth. The baby will go through the same withdrawal an adult does.  SEEK MEDICAL CARE IF: You have any concerns or worries during your pregnancy. It is better to call with your questions if you feel they cannot wait, rather than worry about them. DECISIONS ABOUT CIRCUMCISION You may or may not know the sex of your baby. If you know your baby is a boy, it may be time to think about circumcision. Circumcision is the removal of the foreskin of the penis. This is the skin that covers the sensitive end of the penis. There is no proven medical need for this. Often this decision is made on what is popular at the time or based upon religious beliefs and social issues. You can discuss these issues with your caregiver or pediatrician. SEEK IMMEDIATE MEDICAL CARE IF:   An unexplained oral temperature above 102 F (38.9 C) develops, or as your caregiver suggests.   You have leaking of fluid from the vagina (birth canal). If leaking membranes are suspected, take your temperature and tell your caregiver of this when you call.   There is vaginal spotting, bleeding or passing clots. Tell your caregiver of the amount and how many pads are  used.   You develop a bad smelling vaginal discharge with a change in the color from clear to white.   You develop vomiting that lasts more than 24 hours.   You develop chills or fever.   You develop shortness of breath.   You develop burning on urination.   You loose more than 2 pounds of weight or gain more than 2 pounds of weight or as suggested by your   caregiver.   You notice sudden swelling of your face, hands, and feet or legs.   You develop belly (abdominal) pain. Round ligament discomfort is a common non-cancerous (benign) cause of abdominal pain in pregnancy. Your caregiver still must evaluate you.   You develop a severe headache that does not go away.   You develop visual problems, blurred or double vision.   If you have not felt your baby move for more than 1 hour. If you think the baby is not moving as much as usual, eat something with sugar in it and lie down on your left side for an hour. The baby should move at least 4 to 5 times per hour. Call right away if your baby moves less than that.   You fall, are in a car accident or any kind of trauma.   There is mental or physical violence at home.  Document Released: 10/01/2001 Document Revised: 06/19/2011 Document Reviewed: 04/05/2009 [employer] Patient Information 2012 [employer]. 

## 2011-12-06 NOTE — Progress Notes (Signed)
p-92 Thinks she may have yeast infection.  Took ATB  And now has vaginal itching.

## 2011-12-06 NOTE — Progress Notes (Signed)
Well, no c/o. 2/8 u/s: EFW 64%ile, AFV wnl. Fastings: 89-117, most are elevated, PP: all WNL. Pt has increased bedtime NPH to 40 u, states that in the past it seems that her fastings were worse with increased insulin dosage at night. She does not eat before bed, recommended high protein bedtime snack, pt will call in 1 week to let us know how fastings are doing.

## 2011-12-07 LAB — GC/CHLAMYDIA PROBE AMP, URINE: GC Probe Amp, Urine: NEGATIVE

## 2011-12-19 ENCOUNTER — Other Ambulatory Visit: Payer: Self-pay | Admitting: Obstetrics & Gynecology

## 2011-12-20 ENCOUNTER — Encounter: Payer: Medicaid Other | Admitting: Physician Assistant

## 2012-01-03 ENCOUNTER — Ambulatory Visit (INDEPENDENT_AMBULATORY_CARE_PROVIDER_SITE_OTHER): Payer: Medicaid Other | Admitting: Advanced Practice Midwife

## 2012-01-03 VITALS — BP 110/60 | Temp 97.0°F | Wt 234.0 lb

## 2012-01-03 DIAGNOSIS — Z3480 Encounter for supervision of other normal pregnancy, unspecified trimester: Secondary | ICD-10-CM

## 2012-01-03 DIAGNOSIS — O24919 Unspecified diabetes mellitus in pregnancy, unspecified trimester: Secondary | ICD-10-CM

## 2012-01-03 NOTE — Progress Notes (Signed)
FBS 91-87-88-87-90-84-84  2 hr B  99-106-116-117-114    L 97-90  D 118-117-103-87-91-104   Doing well. Doing the snack at night.  Insulin 40u at am and HS and 30u at meals. Also Metformin 1000mg  bid

## 2012-01-03 NOTE — Patient Instructions (Signed)

## 2012-01-03 NOTE — Progress Notes (Signed)
Addended by: Aviva Signs on: 01/03/2012 11:27 AM   Modules accepted: Orders

## 2012-01-03 NOTE — Progress Notes (Signed)
p-88  Ketones small

## 2012-01-14 ENCOUNTER — Ambulatory Visit (HOSPITAL_COMMUNITY)
Admission: RE | Admit: 2012-01-14 | Discharge: 2012-01-14 | Disposition: A | Discharge status: Home / Self Care | Payer: Medicaid Other | Source: Ambulatory Visit | Attending: Advanced Practice Midwife | Admitting: Advanced Practice Midwife

## 2012-01-14 DIAGNOSIS — O09529 Supervision of elderly multigravida, unspecified trimester: Secondary | ICD-10-CM | POA: Insufficient documentation

## 2012-01-14 DIAGNOSIS — O24919 Unspecified diabetes mellitus in pregnancy, unspecified trimester: Secondary | ICD-10-CM

## 2012-01-14 DIAGNOSIS — O99810 Abnormal glucose complicating pregnancy: Secondary | ICD-10-CM | POA: Insufficient documentation

## 2012-01-14 DIAGNOSIS — O09299 Supervision of pregnancy with other poor reproductive or obstetric history, unspecified trimester: Secondary | ICD-10-CM | POA: Insufficient documentation

## 2012-01-15 ENCOUNTER — Ambulatory Visit (INDEPENDENT_AMBULATORY_CARE_PROVIDER_SITE_OTHER): Payer: Medicaid Other | Admitting: Obstetrics & Gynecology

## 2012-01-15 DIAGNOSIS — O99810 Abnormal glucose complicating pregnancy: Secondary | ICD-10-CM

## 2012-01-15 DIAGNOSIS — O0990 Supervision of high risk pregnancy, unspecified, unspecified trimester: Secondary | ICD-10-CM

## 2012-01-15 NOTE — Progress Notes (Signed)
fastings-80s to low 90s; post prandials-110s, to low low 120s, two valles are 140.  80% growth on 01/13/12.  AC >97%.  RPt [place] at 38 weeks.  Still needs weekly AFIs.  Pt having transportation issues.  Wants to get NST and AFI on same day.  Will check with Diane Day and MFM.  Still needs weekly MD appt and NST here.  Pt advised to eat well, take insulin, and exercise.

## 2012-01-15 NOTE — Progress Notes (Signed)
p-84 

## 2012-01-17 ENCOUNTER — Other Ambulatory Visit: Payer: Self-pay | Admitting: Obstetrics and Gynecology

## 2012-01-20 ENCOUNTER — Ambulatory Visit (INDEPENDENT_AMBULATORY_CARE_PROVIDER_SITE_OTHER): Payer: Medicaid Other | Admitting: *Deleted

## 2012-01-20 ENCOUNTER — Ambulatory Visit (HOSPITAL_COMMUNITY): Payer: Medicaid Other

## 2012-01-20 VITALS — BP 116/72 | Wt 238.8 lb

## 2012-01-20 DIAGNOSIS — O99810 Abnormal glucose complicating pregnancy: Secondary | ICD-10-CM

## 2012-01-20 NOTE — Progress Notes (Signed)
NST performed on 01/20/2012 was reviewed and was found to be reactive.  AFI 8.3 cm. Continue recommended antenatal testing and prenatal care.

## 2012-01-22 ENCOUNTER — Encounter: Payer: Medicaid Other | Admitting: Obstetrics & Gynecology

## 2012-01-24 ENCOUNTER — Ambulatory Visit (INDEPENDENT_AMBULATORY_CARE_PROVIDER_SITE_OTHER): Payer: Medicaid Other | Admitting: Family

## 2012-01-24 VITALS — BP 108/70 | Temp 98.3°F | Wt 238.0 lb

## 2012-01-24 DIAGNOSIS — Z3480 Encounter for supervision of other normal pregnancy, unspecified trimester: Secondary | ICD-10-CM

## 2012-01-24 DIAGNOSIS — O24419 Gestational diabetes mellitus in pregnancy, unspecified control: Secondary | ICD-10-CM

## 2012-01-24 DIAGNOSIS — O99810 Abnormal glucose complicating pregnancy: Secondary | ICD-10-CM | POA: Insufficient documentation

## 2012-01-24 NOTE — Progress Notes (Signed)
Forgot to bring log today; FBS 85, reports PP after lunch in 60's and feeling dizzy; decrease lunch regular to 28; [facility] (bring log); NST reactive today

## 2012-01-24 NOTE — Progress Notes (Signed)
p-88 

## 2012-01-27 ENCOUNTER — Ambulatory Visit (INDEPENDENT_AMBULATORY_CARE_PROVIDER_SITE_OTHER): Payer: Medicaid Other | Admitting: *Deleted

## 2012-01-27 VITALS — BP 143/64 | Wt 238.0 lb

## 2012-01-27 DIAGNOSIS — O99810 Abnormal glucose complicating pregnancy: Secondary | ICD-10-CM

## 2012-01-27 NOTE — Progress Notes (Signed)
P-89 

## 2012-01-30 ENCOUNTER — Ambulatory Visit (INDEPENDENT_AMBULATORY_CARE_PROVIDER_SITE_OTHER): Payer: Medicaid Other | Admitting: *Deleted

## 2012-01-30 VITALS — BP 136/70 | Temp 98.2°F | Wt 239.0 lb

## 2012-01-30 DIAGNOSIS — O24919 Unspecified diabetes mellitus in pregnancy, unspecified trimester: Secondary | ICD-10-CM

## 2012-01-30 NOTE — Progress Notes (Signed)
NST only  p-85  NST reactive

## 2012-01-30 NOTE — Progress Notes (Signed)
01/27/2012 NST reviewed- Category I

## 2012-02-03 ENCOUNTER — Ambulatory Visit (INDEPENDENT_AMBULATORY_CARE_PROVIDER_SITE_OTHER): Payer: Medicaid Other | Admitting: *Deleted

## 2012-02-03 VITALS — BP 136/62 | Wt 237.3 lb

## 2012-02-03 DIAGNOSIS — O99810 Abnormal glucose complicating pregnancy: Secondary | ICD-10-CM

## 2012-02-03 NOTE — Progress Notes (Signed)
P= 86  

## 2012-02-10 ENCOUNTER — Ambulatory Visit (INDEPENDENT_AMBULATORY_CARE_PROVIDER_SITE_OTHER): Payer: Medicaid Other | Admitting: *Deleted

## 2012-02-10 VITALS — BP 129/73

## 2012-02-10 DIAGNOSIS — O99810 Abnormal glucose complicating pregnancy: Secondary | ICD-10-CM

## 2012-02-10 NOTE — Progress Notes (Signed)
P = 96    [place] growth scheduled 02/17/12 @ 1530

## 2012-02-10 NOTE — Progress Notes (Incomplete)
4/15 NST reviewed- category I

## 2012-02-12 NOTE — Progress Notes (Incomplete)
4/22 NST reviewed-category I 

## 2012-02-14 ENCOUNTER — Ambulatory Visit (INDEPENDENT_AMBULATORY_CARE_PROVIDER_SITE_OTHER): Payer: Medicaid Other | Admitting: Family

## 2012-02-14 DIAGNOSIS — O24919 Unspecified diabetes mellitus in pregnancy, unspecified trimester: Secondary | ICD-10-CM

## 2012-02-14 DIAGNOSIS — Z3480 Encounter for supervision of other normal pregnancy, unspecified trimester: Secondary | ICD-10-CM

## 2012-02-14 NOTE — Progress Notes (Signed)
p-83 

## 2012-02-14 NOTE — Progress Notes (Signed)
FBS 86-107 (8/16 abnl);  Postprandial 84-130 (3/36 abnorml); pt reports that she has increased hs NPH to 50 on her own to get fbs better (4 days); up NPH at HS 52; NST Cat I tracing; growth [place] scheduled for 02/17/12.  Plan to schedule IOL at 39 wks.

## 2012-02-17 ENCOUNTER — Ambulatory Visit (HOSPITAL_COMMUNITY)
Admission: RE | Admit: 2012-02-17 | Discharge: 2012-02-17 | Disposition: A | Discharge status: Home / Self Care | Payer: Medicaid Other | Source: Ambulatory Visit | Attending: Obstetrics & Gynecology | Admitting: Obstetrics & Gynecology

## 2012-02-17 ENCOUNTER — Ambulatory Visit (INDEPENDENT_AMBULATORY_CARE_PROVIDER_SITE_OTHER): Payer: Medicaid Other | Admitting: *Deleted

## 2012-02-17 VITALS — BP 119/58 | Wt 241.0 lb

## 2012-02-17 DIAGNOSIS — O09529 Supervision of elderly multigravida, unspecified trimester: Secondary | ICD-10-CM | POA: Insufficient documentation

## 2012-02-17 DIAGNOSIS — O09299 Supervision of pregnancy with other poor reproductive or obstetric history, unspecified trimester: Secondary | ICD-10-CM | POA: Insufficient documentation

## 2012-02-17 DIAGNOSIS — O99810 Abnormal glucose complicating pregnancy: Secondary | ICD-10-CM

## 2012-02-17 NOTE — Progress Notes (Signed)
P = 96  [place] growth done today

## 2012-02-20 NOTE — Progress Notes (Incomplete)
NST today-baseline 130, accels, mod variability, no decels.

## 2012-02-21 ENCOUNTER — Other Ambulatory Visit: Payer: Self-pay | Admitting: Obstetrics & Gynecology

## 2012-02-21 ENCOUNTER — Other Ambulatory Visit: Payer: Self-pay | Admitting: Family

## 2012-02-21 ENCOUNTER — Ambulatory Visit (INDEPENDENT_AMBULATORY_CARE_PROVIDER_SITE_OTHER): Payer: Medicaid Other | Admitting: Family

## 2012-02-21 VITALS — BP 122/79 | Temp 98.5°F | Wt 237.0 lb

## 2012-02-21 DIAGNOSIS — O24419 Gestational diabetes mellitus in pregnancy, unspecified control: Secondary | ICD-10-CM

## 2012-02-21 DIAGNOSIS — O24919 Unspecified diabetes mellitus in pregnancy, unspecified trimester: Secondary | ICD-10-CM

## 2012-02-21 LAB — OB RESULTS CONSOLE GC/CHLAMYDIA: Chlamydia: NEGATIVE

## 2012-02-21 NOTE — Progress Notes (Signed)
p-87  Induction scheduled for 02/26/12 @ 7:30am.

## 2012-02-21 NOTE — Progress Notes (Signed)
Reviewed [place] from 4/29, EFW 77%; AFI 12.64; did not bring glucose log; 2 hour postprandial 75; NST reactive; induction of labor scheduled for 02/26/12 for 07:30

## 2012-02-22 ENCOUNTER — Other Ambulatory Visit: Payer: Self-pay | Admitting: Obstetrics & Gynecology

## 2012-02-24 ENCOUNTER — Encounter: Payer: Self-pay | Admitting: Family

## 2012-02-24 ENCOUNTER — Telehealth (HOSPITAL_COMMUNITY): Payer: Self-pay | Admitting: *Deleted

## 2012-02-24 ENCOUNTER — Other Ambulatory Visit: Payer: Medicaid Other

## 2012-02-24 ENCOUNTER — Ambulatory Visit (HOSPITAL_COMMUNITY): Payer: Medicaid Other

## 2012-02-24 ENCOUNTER — Encounter (HOSPITAL_COMMUNITY): Payer: Self-pay | Admitting: *Deleted

## 2012-02-24 ENCOUNTER — Telehealth: Payer: Self-pay | Admitting: *Deleted

## 2012-02-24 LAB — CULTURE, BETA STREP (GROUP B ONLY)

## 2012-02-24 NOTE — Telephone Encounter (Signed)
Preadmission screen  

## 2012-02-24 NOTE — Telephone Encounter (Signed)
Called pt re: appt today for NST/AFI.  Pt states that she had forgotten about the appt. She is unable to come in @ this time because she lives over 45 min. away. She reports good FM and has no sx of labor. She had reactive NST on 5/3. She is scheduled for IOL on 02/26/12.  Pt will keep IOL appt as scheduled and will come to hospital for decr. FM or sx of labor.

## 2012-02-26 ENCOUNTER — Inpatient Hospital Stay (HOSPITAL_COMMUNITY): Payer: Medicaid Other | Admitting: Anesthesiology

## 2012-02-26 ENCOUNTER — Inpatient Hospital Stay (HOSPITAL_COMMUNITY)
Admission: RE | Admit: 2012-02-26 | Discharge: 2012-02-27 | DRG: 775 | Disposition: A | Discharge status: Home / Self Care | Payer: Medicaid Other | Source: Ambulatory Visit | Attending: Obstetrics & Gynecology | Admitting: Obstetrics & Gynecology

## 2012-02-26 ENCOUNTER — Encounter (HOSPITAL_COMMUNITY): Payer: Self-pay | Admitting: Anesthesiology

## 2012-02-26 ENCOUNTER — Encounter (HOSPITAL_COMMUNITY): Payer: Self-pay

## 2012-02-26 DIAGNOSIS — O09529 Supervision of elderly multigravida, unspecified trimester: Secondary | ICD-10-CM

## 2012-02-26 DIAGNOSIS — O99814 Abnormal glucose complicating childbirth: Secondary | ICD-10-CM | POA: Diagnosis present

## 2012-02-26 LAB — CBC
HCT: 39 % (ref 36.0–46.0)
Hemoglobin: 12.7 g/dL (ref 12.0–15.0)
MCH: 29.3 pg (ref 26.0–34.0)
MCV: 90.1 fL (ref 78.0–100.0)
Platelets: 195 10*3/uL (ref 150–400)
RBC: 4.33 MIL/uL (ref 3.87–5.11)
WBC: 10.2 10*3/uL (ref 4.0–10.5)

## 2012-02-26 LAB — COMPREHENSIVE METABOLIC PANEL
Albumin: 2.8 g/dL — ABNORMAL LOW (ref 3.5–5.2)
Alkaline Phosphatase: 89 U/L (ref 39–117)
BUN: 11 mg/dL (ref 6–23)
Calcium: 9.6 mg/dL (ref 8.4–10.5)
Creatinine, Ser: 0.51 mg/dL (ref 0.50–1.10)
GFR calc Af Amer: >90 mL/min (ref >90)
Glucose, Bld: 70 mg/dL (ref 70–99)
Potassium: 3.8 mEq/L (ref 3.5–5.1)
Total Protein: 6.5 g/dL (ref 6.0–8.3)

## 2012-02-26 LAB — GLUCOSE, CAPILLARY
Glucose-Capillary: 69 mg/dL — ABNORMAL LOW (ref 70–99)
Glucose-Capillary: 67 mg/dL — ABNORMAL LOW (ref 70–99)
Glucose-Capillary: 66 mg/dL — ABNORMAL LOW (ref 70–99)
Glucose-Capillary: 137 mg/dL — ABNORMAL HIGH (ref 70–99)

## 2012-02-26 LAB — OB RESULTS CONSOLE HIV ANTIBODY (ROUTINE TESTING): HIV: NONREACTIVE

## 2012-02-26 MED ORDER — OXYCODONE-ACETAMINOPHEN 5-325 MG PO TABS: 1.0000 | ORAL_TABLET | ORAL | Status: DC | PRN

## 2012-02-26 MED ORDER — DIPHENHYDRAMINE HCL 50 MG/ML IJ SOLN
12.5000 mg | INTRAMUSCULAR | Status: AC | PRN
  Administered 2012-02-26 (×3): 12.5 mg via INTRAVENOUS
  Filled 2012-02-26: qty 1

## 2012-02-26 MED ORDER — ALBUTEROL SULFATE HFA 108 (90 BASE) MCG/ACT IN AERS
2.0000 | INHALATION_SPRAY | Freq: Four times a day (QID) | RESPIRATORY_TRACT | Status: DC | PRN
  Filled 2012-02-26: qty 6.7

## 2012-02-26 MED ORDER — SIMETHICONE 80 MG PO CHEW: 80.0000 mg | CHEWABLE_TABLET | ORAL | Status: DC | PRN

## 2012-02-26 MED ORDER — EPHEDRINE 5 MG/ML INJ
10.0000 mg | INTRAVENOUS | Status: DC | PRN
  Filled 2012-02-26 (×2): qty 4

## 2012-02-26 MED ORDER — LACTATED RINGERS IV SOLN
500.0000 mL | INTRAVENOUS | Status: DC | PRN
  Administered 2012-02-26: 1000 mL via INTRAVENOUS

## 2012-02-26 MED ORDER — PHENYLEPHRINE 40 MCG/ML (10ML) SYRINGE FOR IV PUSH (FOR BLOOD PRESSURE SUPPORT)
80.0000 ug | PREFILLED_SYRINGE | INTRAVENOUS | Status: DC | PRN
  Filled 2012-02-26: qty 5

## 2012-02-26 MED ORDER — TETANUS-DIPHTH-ACELL PERTUSSIS 5-2.5-18.5 LF-MCG/0.5 IM SUSP: 0.5000 mL | Freq: Once | INTRAMUSCULAR | Status: DC

## 2012-02-26 MED ORDER — SENNOSIDES-DOCUSATE SODIUM 8.6-50 MG PO TABS
2.0000 | ORAL_TABLET | Freq: Every day | ORAL | Status: DC
  Administered 2012-02-26: 2 via ORAL

## 2012-02-26 MED ORDER — WITCH HAZEL-GLYCERIN EX PADS: 1.0000 "application " | MEDICATED_PAD | CUTANEOUS | Status: DC | PRN

## 2012-02-26 MED ORDER — ACETAMINOPHEN 325 MG PO TABS: 650.0000 mg | ORAL_TABLET | ORAL | Status: DC | PRN

## 2012-02-26 MED ORDER — FENTANYL 2.5 MCG/ML BUPIVACAINE 1/10 % EPIDURAL INFUSION (WH - ANES)
14.0000 mL/h | INTRAMUSCULAR | Status: DC
Start: 2012-02-26 — End: 2012-02-26
  Administered 2012-02-26: 14 mL/h via EPIDURAL
  Filled 2012-02-26 (×2): qty 60

## 2012-02-26 MED ORDER — LORATADINE 10 MG PO TABS
10.0000 mg | ORAL_TABLET | Freq: Every day | ORAL | Status: DC
  Filled 2012-02-26 (×2): qty 1

## 2012-02-26 MED ORDER — PRENATAL MULTIVITAMIN CH: 1.0000 | ORAL_TABLET | Freq: Every day | ORAL | Status: DC

## 2012-02-26 MED ORDER — OXYCODONE-ACETAMINOPHEN 5-325 MG PO TABS
1.0000 | ORAL_TABLET | ORAL | Status: DC | PRN
  Administered 2012-02-26: 1 via ORAL
  Filled 2012-02-26: qty 1

## 2012-02-26 MED ORDER — OXYTOCIN 20 UNITS IN LACTATED RINGERS INFUSION - SIMPLE: 125.0000 mL/h | Freq: Once | INTRAVENOUS | Status: DC

## 2012-02-26 MED ORDER — LANOLIN HYDROUS EX OINT: TOPICAL_OINTMENT | CUTANEOUS | Status: DC | PRN

## 2012-02-26 MED ORDER — DIBUCAINE 1 % RE OINT: 1.0000 "application " | TOPICAL_OINTMENT | RECTAL | Status: DC | PRN

## 2012-02-26 MED ORDER — ONDANSETRON HCL 4 MG/2ML IJ SOLN: 4.0000 mg | Freq: Four times a day (QID) | INTRAMUSCULAR | Status: DC | PRN

## 2012-02-26 MED ORDER — FLEET ENEMA 7-19 GM/118ML RE ENEM: 1.0000 | ENEMA | RECTAL | Status: DC | PRN

## 2012-02-26 MED ORDER — BENZOCAINE-MENTHOL 20-0.5 % EX AERO: 1.0000 "application " | INHALATION_SPRAY | CUTANEOUS | Status: DC | PRN

## 2012-02-26 MED ORDER — OXYTOCIN BOLUS FROM INFUSION
500.0000 mL | Freq: Once | INTRAVENOUS | Status: DC
  Filled 2012-02-26: qty 500

## 2012-02-26 MED ORDER — LACTATED RINGERS IV SOLN
INTRAVENOUS | Status: DC
  Administered 2012-02-26: 900 mL via INTRAVENOUS
  Administered 2012-02-26: 125 mL/h via INTRAVENOUS

## 2012-02-26 MED ORDER — IBUPROFEN 600 MG PO TABS
600.0000 mg | ORAL_TABLET | Freq: Four times a day (QID) | ORAL | Status: DC | PRN
  Administered 2012-02-26: 600 mg via ORAL
  Filled 2012-02-26: qty 1

## 2012-02-26 MED ORDER — LACTATED RINGERS IV SOLN: 500.0000 mL | Freq: Once | INTRAVENOUS | Status: DC

## 2012-02-26 MED ORDER — ZOLPIDEM TARTRATE 5 MG PO TABS: 5.0000 mg | ORAL_TABLET | Freq: Every evening | ORAL | Status: DC | PRN

## 2012-02-26 MED ORDER — OXYTOCIN 20 UNITS IN LACTATED RINGERS INFUSION - SIMPLE
1.0000 m[IU]/min | INTRAVENOUS | Status: DC
Start: 2012-02-26 — End: 2012-02-26
  Administered 2012-02-26: 4 m[IU]/min via INTRAVENOUS
  Administered 2012-02-26: 6 m[IU]/min via INTRAVENOUS
  Administered 2012-02-26: 2 m[IU]/min via INTRAVENOUS
  Filled 2012-02-26: qty 1000

## 2012-02-26 MED ORDER — TERBUTALINE SULFATE 1 MG/ML IJ SOLN: 0.2500 mg | Freq: Once | INTRAMUSCULAR | Status: DC | PRN

## 2012-02-26 MED ORDER — LIDOCAINE HCL (PF) 1 % IJ SOLN
30.0000 mL | INTRAMUSCULAR | Status: DC | PRN
  Filled 2012-02-26: qty 30

## 2012-02-26 MED ORDER — EPHEDRINE 5 MG/ML INJ: 10.0000 mg | INTRAVENOUS | Status: DC | PRN

## 2012-02-26 MED ORDER — FENTANYL 2.5 MCG/ML BUPIVACAINE 1/10 % EPIDURAL INFUSION (WH - ANES)
INTRAMUSCULAR | Status: DC | PRN
  Administered 2012-02-26: 14 mL/h via EPIDURAL

## 2012-02-26 MED ORDER — CITRIC ACID-SODIUM CITRATE 334-500 MG/5ML PO SOLN: 30.0000 mL | ORAL | Status: DC | PRN

## 2012-02-26 MED ORDER — IBUPROFEN 600 MG PO TABS
600.0000 mg | ORAL_TABLET | Freq: Four times a day (QID) | ORAL | Status: DC
  Administered 2012-02-27 (×3): 600 mg via ORAL
  Filled 2012-02-26 (×3): qty 1

## 2012-02-26 MED ORDER — NALBUPHINE SYRINGE 5 MG/0.5 ML
2.5000 mg | INJECTION | Freq: Once | INTRAMUSCULAR | Status: AC
  Administered 2012-02-26: 2.5 mg via INTRAVENOUS
  Filled 2012-02-26: qty 0.5

## 2012-02-26 MED ORDER — ONDANSETRON HCL 4 MG PO TABS: 4.0000 mg | ORAL_TABLET | ORAL | Status: DC | PRN

## 2012-02-26 MED ORDER — SODIUM BICARBONATE 8.4 % IV SOLN
INTRAVENOUS | Status: DC | PRN
  Administered 2012-02-26: 4 mL via EPIDURAL

## 2012-02-26 MED ORDER — ONDANSETRON HCL 4 MG/2ML IJ SOLN: 4.0000 mg | INTRAMUSCULAR | Status: DC | PRN

## 2012-02-26 MED ORDER — PHENYLEPHRINE 40 MCG/ML (10ML) SYRINGE FOR IV PUSH (FOR BLOOD PRESSURE SUPPORT): 80.0000 ug | PREFILLED_SYRINGE | INTRAVENOUS | Status: DC | PRN

## 2012-02-26 MED ORDER — DIPHENHYDRAMINE HCL 25 MG PO CAPS: 25.0000 mg | ORAL_CAPSULE | Freq: Four times a day (QID) | ORAL | Status: DC | PRN

## 2012-02-26 NOTE — Progress Notes (Signed)
  Subjective:   Objective: BP 144/83  Pulse 72  Temp(Src) 97.7 F (36.5 C) (Oral)  Ht 5\' 6"  (1.676 m)  Wt 107.502 kg (237 lb)  BMI 38.25 kg/m2  SpO2 100%  Breastfeeding? Unknown      FHT:  FHR: 130s bpm, variability: moderate,  accelerations:  Present,  decelerations:  Absent UC:   regular, every 3-4 minutes SVE:   Dilation: 4.5 Effacement (%): 80 Station: -2 Exam by:: Dherr rn  Labs: Lab Results  Component Value Date   WBC 10.2 02/26/2012   HGB 12.7 02/26/2012   HCT 39.0 02/26/2012   MCV 90.1 02/26/2012   PLT 195 02/26/2012    Assessment / Plan: FSE placed due to difficulty tracing baby.  Continue titrating pitocin.  Blood sugars 60-70.    Jaime Morrow JEHIEL 02/26/2012, 1:02 PM

## 2012-02-26 NOTE — Anesthesia Preprocedure Evaluation (Addendum)
Anesthesia Evaluation  Patient identified by MRN, date of birth, ID band Patient awake    Reviewed: Allergy & Precautions, H&P , Patient's Chart, lab work & pertinent test results  Airway Mallampati: II TM Distance: >3 FB Neck ROM: full    Dental  (+) Teeth Intact   Pulmonary asthma ,  breath sounds clear to auscultation        Cardiovascular Rhythm:regular Rate:Normal     Neuro/Psych    GI/Hepatic   Endo/Other  Diabetes mellitus-, GestationalMorbid obesity  Renal/GU      Musculoskeletal   Abdominal   Peds  Hematology   Anesthesia Other Findings       Reproductive/Obstetrics (+) Pregnancy                          Anesthesia Physical Anesthesia Plan  ASA: III  Anesthesia Plan: Epidural   Post-op Pain Management:    Induction:   Airway Management Planned:   Additional Equipment:   Intra-op Plan:   Post-operative Plan:   Informed Consent: I have reviewed the patients History and Physical, chart, labs and discussed the procedure including the risks, benefits and alternatives for the proposed anesthesia with the patient or authorized representative who has indicated his/her understanding and acceptance.   Dental Advisory Given  Plan Discussed with:   Anesthesia Plan Comments: (Labs checked- platelets confirmed with RN in room. Fetal heart tracing, per RN, reported to be stable enough for sitting procedure. Discussed epidural, and patient consents to the procedure:  included risk of possible headache,backache, failed block, allergic reaction, and nerve injury. This patient was asked if she had any questions or concerns before the procedure started. )        Anesthesia Quick Evaluation  

## 2012-02-26 NOTE — H&P (Signed)
Jaime Morrow is a 36 y.o. female presenting for IOL due to GDM. Maternal Medical History:  Reason for admission: Reason for Admission:   nauseaIOL due to GDM  Contractions: Frequency: irregular.   Perceived severity is mild.    Fetal activity: Perceived fetal activity is normal.   Last perceived fetal movement was within the past hour.    Prenatal Complications - Diabetes: gestational. Diabetes is managed by diet, insulin injections and oral agent (monotherapy).      OB History    Grav Para Term Preterm Abortions TAB SAB Ect Mult Living   10 7 7  2  2   7      Past Medical History  Diagnosis Date  . Anemia   . Asthma   . Blood transfusion abn reaction or complication, no procedure mishap   . 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 Gestational diabetes     insulin and metformin  . Pregnancy induced hypertension     last pregnancy   Past Surgical History  Procedure Date  . Dilation and curettage of uterus 2005 x 2  . Lumbar laminectomy 2007   Family History: family history includes Alcohol abuse in her maternal grandfather and paternal grandfather; COPD in her maternal grandmother; Cancer in her maternal grandfather; Diabetes type II in her maternal grandmother; and Rheum arthritis in her maternal grandmother. Social History:  reports that she has quit smoking. She has never used smokeless tobacco. She reports that she does not drink alcohol or use illicit drugs.  Review of Systems  Constitutional: Negative for fever and chills.  Eyes: Negative for blurred vision and double vision.  Respiratory: Negative for sputum production.   Cardiovascular: Negative for chest pain.  Gastrointestinal: Negative for heartburn, nausea, vomiting, abdominal pain, diarrhea, constipation and blood in stool.  Genitourinary: Negative for dysuria, urgency, frequency and hematuria.  Skin: Negative for rash.  Neurological: Negative for sensory change and headaches.    Dilation: 2 Effacement (%): 50 Cervical Position: Posterior Station: -2 Presentation: Vertex Exam by:: Dr Adrian Blackwater   Blood pressure 154/77, pulse 86, temperature 97.7 F (36.5 C), temperature source Oral, height 5\' 6"  (1.676 m), weight 107.502 kg (237 lb), unknown if currently breastfeeding. Maternal Exam:  Uterine Assessment: Contraction frequency is rare.   Abdomen: Patient reports no abdominal tenderness. Fundal height is Term.   Estimated fetal weight is 8-8.5.   Fetal presentation: vertex  Introitus: Normal vulva. Normal vagina.  Pelvis: adequate for delivery.   Cervix: Cervix evaluated by digital exam.     Physical Exam  Constitutional: She is oriented to person, place, and time. She appears well-developed and well-nourished.  Eyes: EOM are normal.  Neck: Normal range of motion.  Cardiovascular: Normal rate and regular rhythm.   Respiratory: Effort normal.  GI: There is no rebound and no guarding.  Musculoskeletal: Normal range of motion.  Neurological: She is alert and oriented to person, place, and time.  Skin: Skin is warm and dry. No rash noted.    Prenatal labs: ABO, Rh: A/POS/-- (11/28 0901) Antibody: NEG (11/28 0901) Rubella: 56.2 (11/28 0901) RPR: NON REAC (01/18 1052)  HBsAg: NEGATIVE (11/28 0901)  HIV: Non-reactive (05/08 0000)  GBS: Negative (05/03 0000)   Assessment/Plan: 35yo [id number] [redacted]w[redacted]d here for IOL due to GDM. - Admit to L&D - Continuous monitoring - Pitocin - Epidural - Expectant management   Jaime Munos, MD 02/26/2012, 8:47 AM

## 2012-02-26 NOTE — H&P (Signed)
Attestation of Attending Supervision of Resident: Evaluation and management procedures were performed by the Family Medicine Resident under my supervision.  I have seen and examined the patient, reviewed the resident's note and chart, and I agree with management and plan.  Kadian Barcellos, M.D. 02/26/2012 10:19 AM    

## 2012-02-26 NOTE — Progress Notes (Signed)
Jaime Morrow is a 36 y.o. [id number] at [redacted]w[redacted]d   Subjective: Doing well. Wanting to rest.   Objective: BP 144/83  Pulse 72  Temp(Src) 97.7 F (36.5 C) (Oral)  Ht 5\' 6"  (1.676 m)  Wt 107.502 kg (237 lb)  BMI 38.25 kg/m2  SpO2 100%  Breastfeeding? Unknown      FHT:  FHR: 120s bpm, variability: moderate,  accelerations:  Present,  decelerations:  Absent UC:   regular, every 3 minutes SVE:   Dilation: 4.5 Effacement (%): 80 Station: -2 Exam by:: Dherr rn  Labs: Lab Results  Component Value Date   WBC 10.2 02/26/2012   HGB 12.7 02/26/2012   HCT 39.0 02/26/2012   MCV 90.1 02/26/2012   PLT 195 02/26/2012    Assessment / Plan: Induction of labor due to gestational diabetes,  progressing well on pitocin  Labor: Progressing on Pitocin, will continue to increase then AROM, Continue CBG monitoring.  Apple juice when low Fetal Wellbeing:  Category I Pain Control:  Epidural I/D:  n/a Anticipated MOD:  NSVD  Roosvelt Churchwell,MD 02/26/2012, 12:10 PM

## 2012-02-26 NOTE — Anesthesia Procedure Notes (Signed)
Epidural Patient location during procedure: OB  Preanesthetic Checklist Completed: patient identified, site marked, surgical consent, pre-op evaluation, timeout performed, IV checked, risks and benefits discussed and monitors and equipment checked  Epidural Patient position: sitting Prep: site prepped and draped and DuraPrep Patient monitoring: continuous pulse ox and blood pressure Approach: midline Injection technique: LOR air  Needle:  Needle type: Tuohy  Needle gauge: 17 G Needle length: 9 cm Needle insertion depth: 5 cm cm Catheter type: closed end flexible Catheter size: 19 Gauge Catheter at skin depth: 10 cm Test dose: negative  Assessment Events: blood not aspirated, injection not painful, no injection resistance, negative IV test and no paresthesia  Additional Notes LOR at 6cm, reconfirmed at 5cm with different angle. Lots of different "pops" and pseudo- LOR's  LOR of 5cm chosen; after comfirmation with Sprotte thru Tuohy (+) CSF, no parasthesia  Dosing of Epidural:  1st dose, through needle ............................................Marland Kitchen epi 1:200K + Xylocaine 40 mg  2nd dose, through catheter, after waiting 3 minutes...Marland KitchenMarland Kitchenepi 1:200K + Xylocaine 40 mg  3rd dose, through catheter after waiting 3 minutes .............................Marcaine   4mg    ( mg Marcaine are expressed as equivilent  cc's medication removed from the 0.1%Bupiv / fentanyl syringe from L&D pump)  ( 2% Xylo charted as a single dose in Epic Meds for ease of charting; actual dosing was fractionated as above, for saftey's sake)  As each dose occurred, patient was free of IV sx; and patient exhibited no evidence of SA injection.  Patient is more comfortable after epidural dosed. Please see RN's note for documentation of vital signs,and FHR which are stable.

## 2012-02-27 MED ORDER — IBUPROFEN 600 MG PO TABS: 600.0000 mg | ORAL_TABLET | Freq: Four times a day (QID) | ORAL | Status: AC

## 2012-02-27 NOTE — Progress Notes (Signed)
Post Partum Day #1  Subjective: no complaints, up ad lib, voiding, tolerating PO and + flatus Patient reports fasting glucose of 90 this morning.   Breastfeeding.  No plans for contraception at this time.   + vaginal bleeding but no abnormal discharge.   Pain well tolerated.   Objective: Blood pressure 107/62, pulse 80, temperature 97.9 F (36.6 C), temperature source Axillary, resp. rate 20, height 5\' 6"  (1.676 m), weight 107.502 kg (237 lb), SpO2 100.00%, unknown if currently breastfeeding.  Physical Exam:  General: alert, cooperative and no distress Lochia: appropriate Uterine Fundus: firm DVT Evaluation: No evidence of DVT seen on physical exam.  No calf tenderness.  2+ PT pulses bilaterally.     Basename 02/26/12 0724  HGB 12.7  HCT 39.0    Assessment/Plan: Plan for discharge tomorrow   LOS: 1 day   Bedelia Person, PA-S 02/27/2012, 7:43 AM   Patient seen and examined.  Agree with above note.  Patient would like to go home later today after 24 hours.  Jaime Morrow 02/27/2012 9:20 AM

## 2012-02-27 NOTE — Progress Notes (Signed)
UR chart review completed.  

## 2012-02-27 NOTE — Discharge Summary (Signed)
Obstetric Discharge Summary Reason for Admission: induction of labor Prenatal Procedures: none Intrapartum Procedures: vacuum Postpartum Procedures: none Complications-Operative and Postpartum: none Hemoglobin  Date Value Range Status  02/26/2012 12.7  12.0-15.0 (g/dL) Final     HCT  Date Value Range Status  02/26/2012 39.0  36.0-46.0 (%) Final    Physical Exam:  General: alert, cooperative and no distress Lochia: appropriate Uterine Fundus: firm DVT Evaluation: No evidence of DVT seen on physical exam. Negative Homan's sign. No cords or calf tenderness.  Discharge Diagnoses: Term Pregnancy-delivered  Discharge Information: Date: 02/27/2012 Activity: pelvic rest Diet: routine Medications: PNV and Ibuprofen Condition: stable Instructions: refer to practice specific booklet Discharge to: home Follow-up Information    Follow up with [name] in 6 weeks.   Contact information:   [address] [place] [address]          Newborn Data: Live born female  Birth Weight: 7 lb 5.8 oz (3340 g) APGAR: 9, 9  Home with mother.  Jaime Morrow JEHIEL 02/27/2012, 9:22 AM

## 2012-02-27 NOTE — Discharge Instructions (Signed)
Vaginal Delivery Care After  Change your pad on each trip to the bathroom.   Wipe gently with toilet paper during your hospital stay. Always wipe from front to back. A spray bottle with warm tap water could also be used or a towelette if available.   Place your soiled pad and toilet paper in a bathroom wastebasket with a plastic bag liner.   During your hospital stay, save any clots. If you pass a clot while on the toilet, do not flush it. Also, if your vaginal flow seems excessive to you, notify nursing personnel.   The first time you get out of bed after delivery, wait for assistance from a nurse. Do not get up alone at any time if you feel weak or dizzy.   Bend and extend your ankles forcefully so that you feel the calves of your legs get hard. Do this 6 times every hour when you are in bed and awake.   Do not sit with one foot under you, dangle your legs over the edge of the bed, or maintain a position that hinders the circulation in your legs.   Many women experience after pains for 2 to 3 days after delivery. These after pains are mild uterine contractions. Ask the nurse for a pain medication if you need something for this. Sometimes breastfeeding stimulates after pains; if you find this to be true, ask for the medication  -  hour before the next feeding.   For you and your infant's protection, do not go beyond the door(s) of the obstetric unit. Do not carry your baby in your arms in the hallway. When taking your baby to and from your room, put your baby in the bassinet and push the bassinet.   Mothers may have their babies in their room as much as they desire.  Document Released: 10/04/2000 Document Revised: 09/26/2011 Document Reviewed: 09/04/2007 [employer] Patient Information 2012 [employer]. 

## 2012-04-10 ENCOUNTER — Other Ambulatory Visit: Payer: Self-pay | Admitting: Advanced Practice Midwife

## 2012-04-10 ENCOUNTER — Ambulatory Visit: Payer: Medicaid Other | Admitting: Advanced Practice Midwife

## 2012-04-10 VITALS — BP 128/81 | HR 86 | Temp 98.4°F | Resp 16 | Ht 66.0 in | Wt 221.0 lb

## 2012-04-10 DIAGNOSIS — O24419 Gestational diabetes mellitus in pregnancy, unspecified control: Secondary | ICD-10-CM

## 2012-04-10 DIAGNOSIS — B3789 Other sites of candidiasis: Secondary | ICD-10-CM

## 2012-04-10 MED ORDER — FLUCONAZOLE 150 MG PO TABS: 150.0000 mg | ORAL_TABLET | Freq: Once | ORAL | Status: AC

## 2012-04-10 NOTE — Patient Instructions (Addendum)
Dr. Marshell Levan.  Candida and Breastfeeding Candida is also called yeast, monilla or thrush. It is a fungus that is naturally on and in our bodies. Candida lives in warm, dark and moist places such as the mucus membranes of the mouth, vagina, diaper area, skin folds, nursing bra pads and on wet nipples. Using antibiotics helps yeast grow because antibiotics kill off the good germs in our body. Yeast infections are more common in pregnancy and people with diabetes. Yeast infections may also lead to plugged ducts (channels for milk to flow in) and an inflammation of the breast (mastitis) and can even form an abscess. Because yeast grows in warm, moist areas, it can be passed back and forth between a mom and her baby. Both mother and baby may need treatment at the same time in order to clear up the infection. It is important to do this even if one of you does not have symptoms. Occasionally, other family members (especially your sexual partner) may need to be treated at the same time.  SYMPTOMS   Severe stinging or burning pain, which may be on the surface of the nipples or may be felt deep inside the breast.   Pain during, in between or especially right after feedings.   Sometimes sharp, shooting pain spreads (radiates) from the nipple into the breast or into the back or arm.   Nipples are sensitive to touch and may have pain with even light touch. Nipples may also be:   Puffy.   Weepy.   Itchy.   Blistering.   Scaly.   Inflamed (reddish).   Flaky.   Painful.  If you or your baby have any of the symptoms described above, especially if you have been on antibiotics, or if your nipples suddenly become sore after the first two weeks after birth, you may have a yeast infection.  DIAGNOSIS   The diagnosis of yeast is often made based on the symptoms.   Microscopic evaluation of the breast discharge or cultures may be needed.  TREATMENT   Talk to your caregiver before starting treatment.  It is important to begin treatment only after making sure other problems are not the cause of the problem.   Apply the antifungal cream that your caregiver gives you.   Wash your nipples with warm water before nursing the baby.   You may be advised to stop nursing from the affected breast and use a breast pump on it.   Keep the affected breast empty of milk with nursing or with a breast pump.   If your baby has thrush or diaper rash, treat it with medication prescribed by your caregiver.   If you are nursing and you have Candida, your baby should be treated for thrush even if you cannot see any white patches in the baby's mouth.  HOME CARE INSTRUCTIONS   Take medications as directed. If you have a ductal yeast infection, you may be prescribed medications by mouth. Make sure to finish all your medications. Make sure your baby is also seen and treated at the same time and finishes their medications as directed.   Wash hands often. This should be done before and after nursing, after using the bathroom and before or after changing the baby's diaper. Use hot, soapy water and soft towels or cloths to pat dry.   Nurse more often, but for shorter times. Start nursing on the least sore side. Numbing the nipple with ice wrapped in a washcloth before nursing may help.  If nursing becomes too painful, you may want to pump your milk temporarily and feed it to your baby by cup or bottle until the pain lessens.    Eat a lot of yogurt that has live active cultures and take oral acidophilus.   Only take over-the-counter or prescription medicines for pain, discomfort or fever as directed by your caregiver.   Use creams or ointments as suggested by your caregiver.   Usually after 24-48 hours, you should feel some improvement. In some cases, symptoms may get worse before they get better. Continue treatment for at least 48 hours.   Air dry your nipples after nursing.   Change bra pads after each  feeding.   Wear 100% cotton bras and wash them every day in hot water.   Wash your breast pump and all its parts thoroughly in a bleach solution and boil them in water for 5 minutes every day.   Consult your caregiver if you do not get better. Oral medications can be prescribed that may be helpful.  SEEK MEDICAL CARE IF:  You feel you or the baby are not getting better or getting worse with the treatment.   You have just taken antibiotics and your breasts develop shooting pains, discomfort, itching or burning.  SEEK IMMEDIATE MEDICAL CARE IF:   You have an oral temperature above 102 F (38.9 C), not controlled by medicine.   You develop swelling and severe pain in your breast.   You develop blisters on your breast.   You feel a lump in your breast, with or without pain.   Your nipple starts bleeding.  MAKE SURE YOU:   Understand these instructions.   Will watch your condition.   Will get help right away if you are not doing well or get worse.  Document Released: 02/01/2005 Document Revised: 09/26/2011 Document Reviewed: 08/03/2008 [facility] Patient Information 2012 [place], [place].

## 2012-04-13 LAB — GLUCOSE, RANDOM: Glucose, Bld: 109 mg/dL — ABNORMAL HIGH (ref 70–99)

## 2012-04-17 ENCOUNTER — Telehealth: Payer: Self-pay | Admitting: *Deleted

## 2012-04-17 NOTE — Telephone Encounter (Signed)
LM on cell phone that her 2hr GTT was abnormal and she needs to f/u with her PCP and if she doesn't have one, we will get her an appt.  Told her that if she didn't get her glucoses under control that it could make her at a higher risk for miscarriages with any future pregnancies.  LM that she is to return our call and confirm that she did receive this message.

## 2012-09-28 IMAGING — US US OB FOLLOW-UP
1 series · 12 of 28 positions shown · non-contrast
Comparison: none

[Series 1: us ob follow up · 57 acquisitions, 12 frames shown]
[im 3/57]
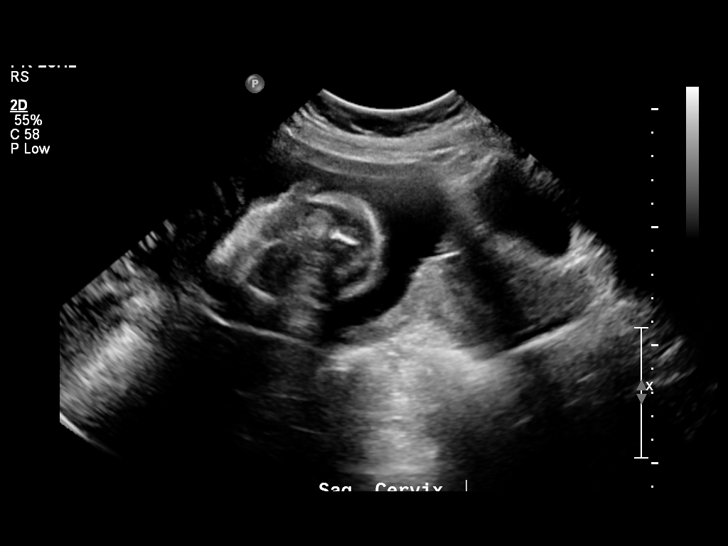
[im 7/57]
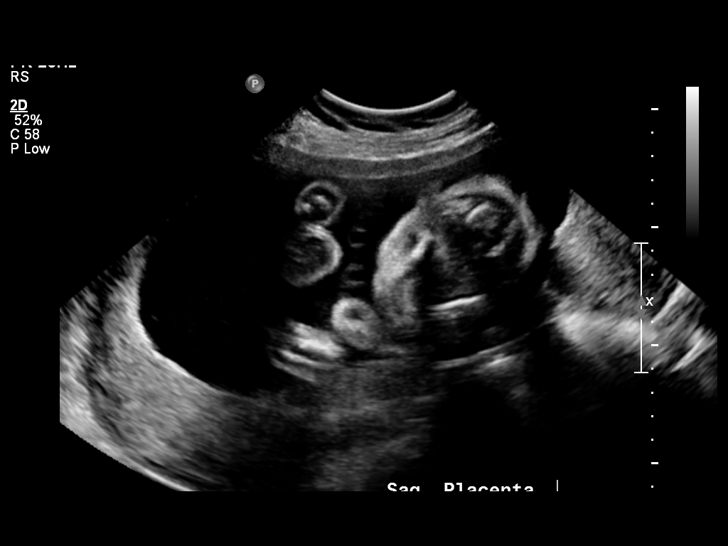
[im 11/57]
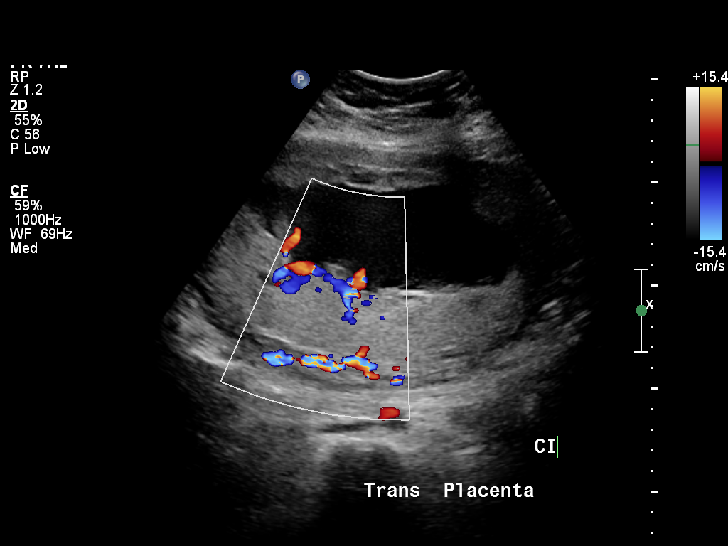
[im 17/57]
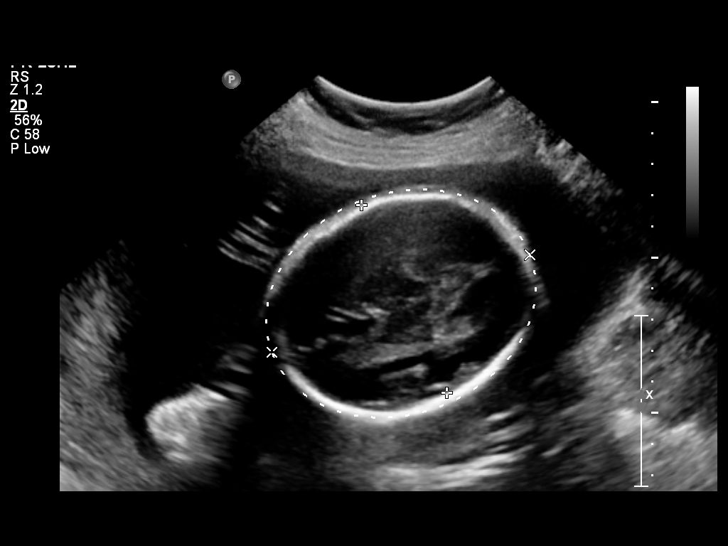
[im 21/57]
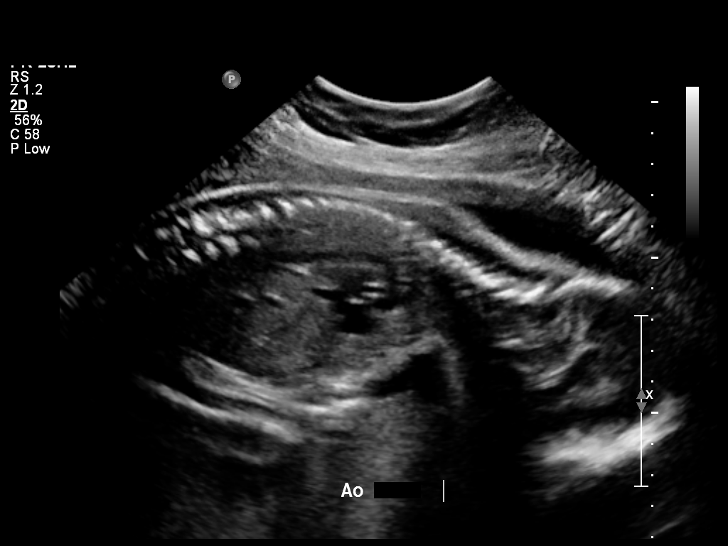
[im 25/57]
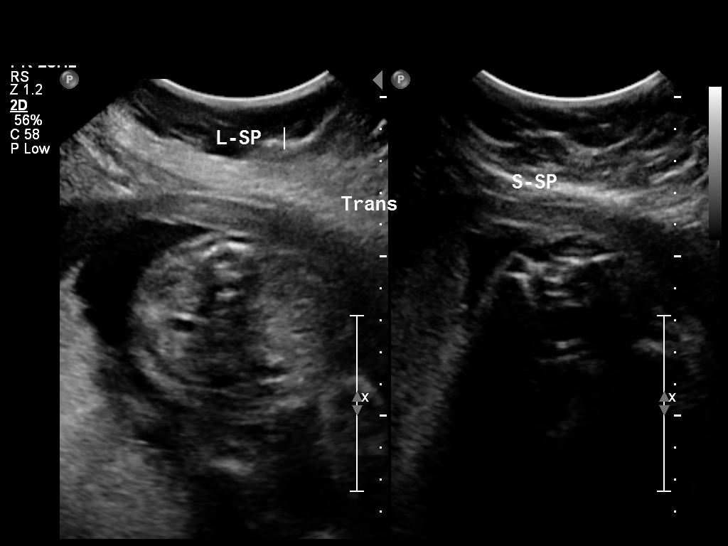
[im 32/57]
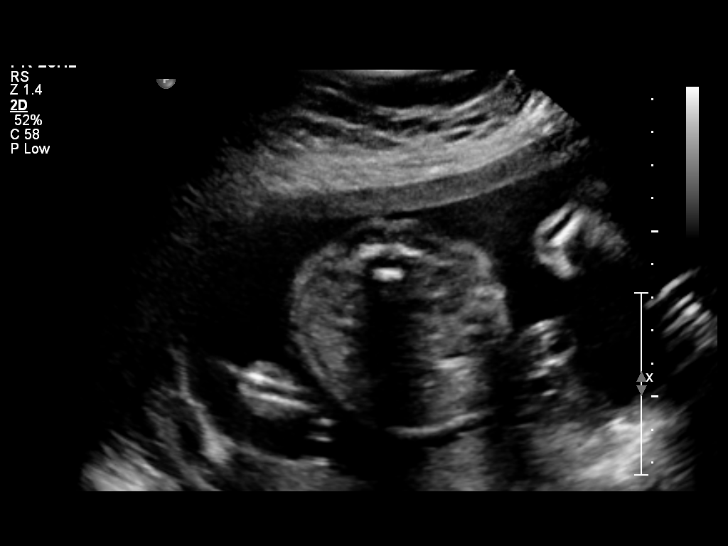
[im 36/57]
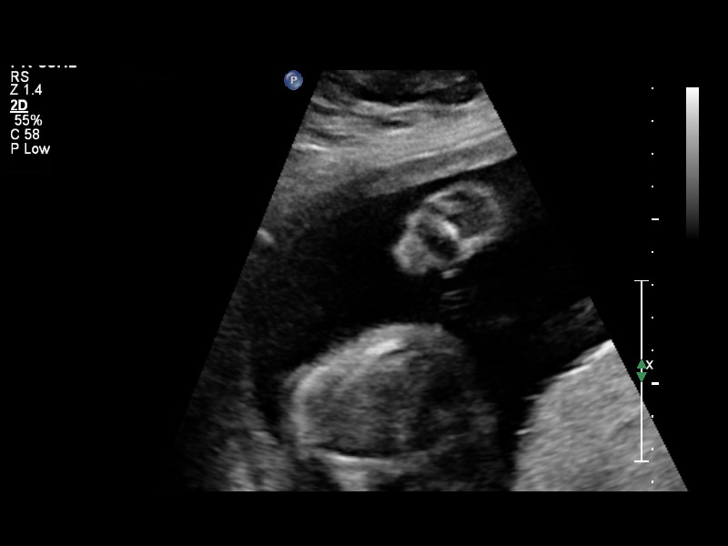
[im 40/57]
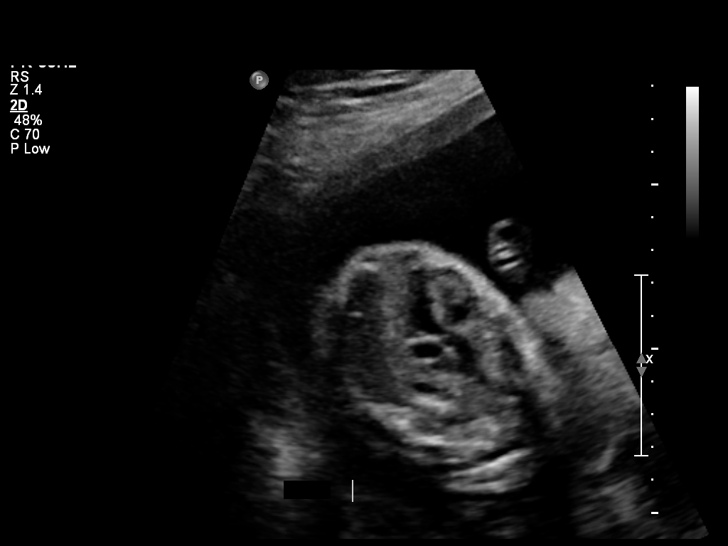
[im 46/57]
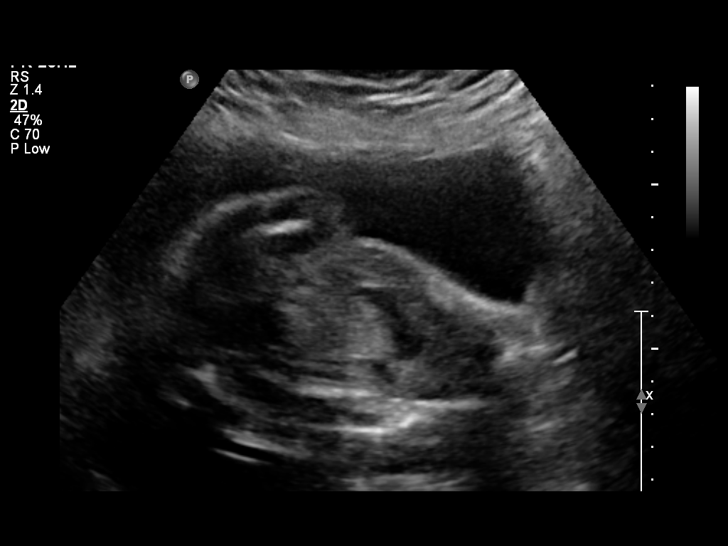
[im 50/57]
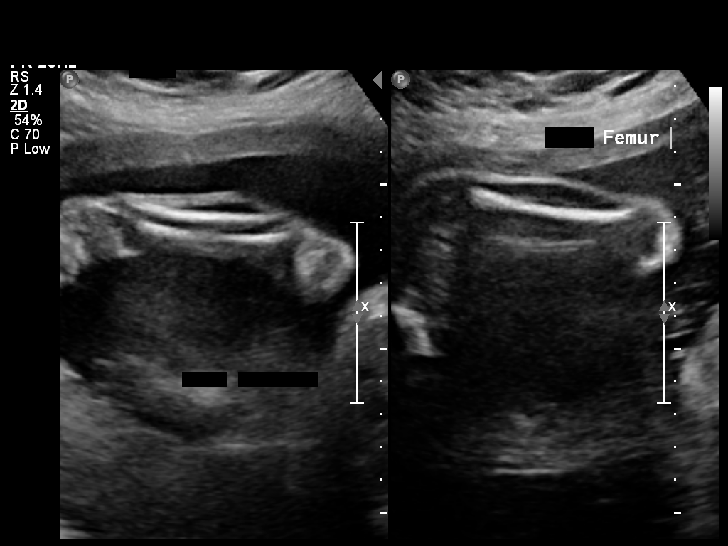
[im 54/57]
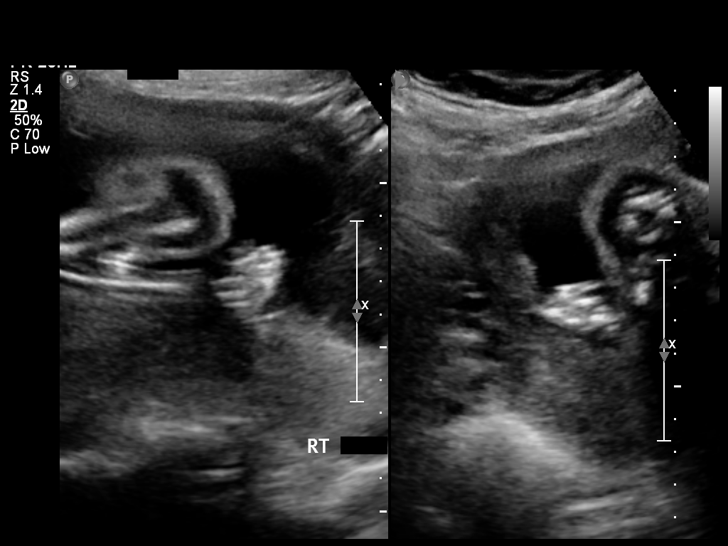

[12 of 28 positions shown; findings below may reference images not displayed]

OBSTETRICS REPORT
                      (Signed Final 11/29/2011 [DATE])

                 CNM
 Order#:         [id number]
Procedures

 US OB FOLLOW UP                                       76816.1
Indications

 Follow-up incomplete fetal anatomic evaluation
 Diabetes - Gestational
 Advanced maternal age (AMA), Multigravida
 Poor obstetric history: Previous preeclampsia /
 eclampsia/gestational HTN
Fetal Evaluation

 Fetal Heart Rate:  140                         bpm
 Cardiac Activity:  Observed
 Presentation:      Cephalic
 Placenta:          Posterior, above cervical
                    os
 P. Cord            Visualized
 Insertion:

 Amniotic Fluid
 AFI FV:      Subjectively within normal limits
                                             Larg Pckt:     7.5  cm
Biometry

 BPD:       66  mm    G. Age:   26w 4d                CI:        70.17   70 - 86
                                                      FL/HC:      19.6   18.6 -

 HC:     251.3  mm    G. Age:   27w 2d       59  %    HC/AC:      1.12   1.04 -

 AC:     224.9  mm    G. Age:   26w 6d       60  %    FL/BPD:     74.7   71 - 87
 FL:      49.3  mm    G. Age:   26w 4d       46  %    FL/AC:      21.9   20 - 24
 HUM:       46  mm    G. Age:   27w 1d       66  %

 Est. FW:     987  gm      2 lb 3 oz     64  %
Gestational Age

 U/S Today:     26w 6d                                        EDD:   02/29/12
 Best:          26w 2d    Det. By:   U/S (09/25/11)           EDD:   03/04/12
Anatomy

 Cranium:           Appears normal      Aortic Arch:       Appears normal
 Fetal Cavum:       Appears normal      Ductal Arch:       Appears normal
 Ventricles:        Appears normal      Diaphragm:         Appears normal
 Choroid Plexus:    Appears normal      Stomach:           Appears
                                                           normal, left
                                                           sided
 Cerebellum:        Appears normal      Abdomen:           Appears normal
 Posterior Fossa:   Appears normal      Abdominal Wall:    Appears nml
                                                           (cord insert,
                                                           abd wall)
 Nuchal Fold:       Previously seen     Cord Vessels:      Appears normal
                                                           (3 vessel cord)
 Face:              Appears normal      Kidneys:           Appear normal
                    (lips/profile/orbit
                    s)
 Heart:             Appears normal      Bladder:           Appears normal
                    (4 chamber &
                    axis)
 RVOT:              Appears normal      Spine:             Appears normal
 LVOT:              Appears normal      Limbs:             Appears normal
                                                           (hands, ankles,
                                                           feet)

 Other:     Male gender. Heels visualized.
Cervix Uterus Adnexa

 Cervical Length:   4.16      cm

 Cervix:       Closed.

 Adnexa:     No abnormality visualized.
Impression

   Single living intrauterine gestation with concordant
 gestational age and normal visualized anatomy.

## 2012-10-21 NOTE — L&D Delivery Note (Signed)
Delivery Note At 9:25 AM a viable female was delivered via Vaginal, Spontaneous Delivery (Presentation: ; Occiput Anterior). Double nuchal cord easily reduced on the perineum.  APGAR: 9,9;  weight 11lb 3.5oz.   Placenta status: Intact, Spontaneous.  Cord: 3 vessel cord.  with the following complications: None.  Pt is a grand mulitp and thus, after delivery received of PR cytotec and IM pitocin.    Anesthesia: None  Episiotomy: None Lacerations: None Suture Repair: na Est. Blood Loss (mL): 250  Mom to postpartum.  Baby to nursery-stable.  Jaidev Sanger L 06/10/2013, 9:43 AM

## 2012-11-24 ENCOUNTER — Other Ambulatory Visit: Payer: Self-pay | Admitting: *Deleted

## 2012-11-24 ENCOUNTER — Ambulatory Visit (INDEPENDENT_AMBULATORY_CARE_PROVIDER_SITE_OTHER): Payer: Self-pay | Admitting: *Deleted

## 2012-11-24 ENCOUNTER — Encounter: Payer: Self-pay | Admitting: *Deleted

## 2012-11-24 VITALS — BP 125/77 | HR 90 | Resp 16 | Ht 65.0 in | Wt 226.0 lb

## 2012-11-24 DIAGNOSIS — Z3480 Encounter for supervision of other normal pregnancy, unspecified trimester: Secondary | ICD-10-CM

## 2012-11-24 NOTE — Patient Instructions (Signed)
Pt here for confirmation of pregnancy.  LMP 09/02/12 Todays bedside u/s showed IUP measuring 32.2mm GA 10w 1 day and FHT of 171 BPM.  She is taking flintstones vitamins.  PNLs drawn today including HGB A1C.  She is scheduled for initial prenatal visit next week with midwife.  She is instructed to bring her current glucose readings with her.

## 2012-11-25 ENCOUNTER — Encounter: Payer: Self-pay | Admitting: Obstetrics & Gynecology

## 2012-11-25 DIAGNOSIS — O0990 Supervision of high risk pregnancy, unspecified, unspecified trimester: Secondary | ICD-10-CM | POA: Insufficient documentation

## 2012-11-25 LAB — OBSTETRIC PANEL
Antibody Screen: NEGATIVE
Eosinophils Relative: 2 % (ref 0–5)
HCT: 38.5 % (ref 36.0–46.0)
Hemoglobin: 13.2 g/dL (ref 12.0–15.0)
Lymphocytes Relative: 17 % (ref 12–46)
MCHC: 34.3 g/dL (ref 30.0–36.0)
MCV: 88.7 fL (ref 78.0–100.0)
Monocytes Absolute: 0.4 10*3/uL (ref 0.1–1.0)
Monocytes Relative: 4 % (ref 3–12)
Neutro Abs: 6.6 10*3/uL (ref 1.7–7.7)
Rh Type: POSITIVE
Rubella: 1.85 Index — ABNORMAL HIGH (ref <0.90)
WBC: 8.5 10*3/uL (ref 4.0–10.5)

## 2012-11-25 LAB — GC/CHLAMYDIA PROBE AMP: GC Probe RNA: NEGATIVE

## 2012-11-30 ENCOUNTER — Ambulatory Visit (INDEPENDENT_AMBULATORY_CARE_PROVIDER_SITE_OTHER): Payer: Self-pay | Admitting: Advanced Practice Midwife

## 2012-11-30 ENCOUNTER — Encounter: Payer: Self-pay | Admitting: Advanced Practice Midwife

## 2012-11-30 VITALS — BP 125/80 | Wt 225.0 lb

## 2012-11-30 DIAGNOSIS — O24319 Unspecified pre-existing diabetes mellitus in pregnancy, unspecified trimester: Secondary | ICD-10-CM

## 2012-11-30 DIAGNOSIS — O0990 Supervision of high risk pregnancy, unspecified, unspecified trimester: Secondary | ICD-10-CM

## 2012-11-30 DIAGNOSIS — O24919 Unspecified diabetes mellitus in pregnancy, unspecified trimester: Secondary | ICD-10-CM

## 2012-11-30 DIAGNOSIS — E119 Type 2 diabetes mellitus without complications: Secondary | ICD-10-CM

## 2012-11-30 HISTORY — DX: Unspecified pre-existing diabetes mellitus in pregnancy, unspecified trimester: O24.319

## 2012-11-30 MED ORDER — METFORMIN HCL 500 MG PO TABS: 500.0000 mg | ORAL_TABLET | Freq: Two times a day (BID) | ORAL | Status: DC

## 2012-11-30 NOTE — Patient Instructions (Signed)
Pregnancy - First Trimester During sexual intercourse, millions of sperm go into the vagina. Only 1 sperm will penetrate and fertilize the female egg while it is in the Fallopian tube. One week later, the fertilized egg implants into the wall of the uterus. An embryo begins to develop into a baby. At 6 to 8 weeks, the eyes and face are formed and the heartbeat can be seen on ultrasound. At the end of 12 weeks (first trimester), all the baby's organs are formed. Now that you are pregnant, you will want to do everything you can to have a healthy baby. Two of the most important things are to get good prenatal care and follow your caregiver's instructions. Prenatal care is all the medical care you receive before the baby's birth. It is given to prevent, find, and treat problems during the pregnancy and childbirth. PRENATAL EXAMS  During prenatal visits, your weight, blood pressure and urine are checked. This is done to make sure you are healthy and progressing normally during the pregnancy.  A pregnant woman should gain 25 to 35 pounds during the pregnancy. However, if you are over weight or underweight, your caregiver will advise you regarding your weight.  Your caregiver will ask and answer questions for you.  Blood work, cervical cultures, other necessary tests and a Pap test are done during your prenatal exams. These tests are done to check on your health and the probable health of your baby. Tests are strongly recommended and done for HIV with your permission. This is the virus that causes AIDS. These tests are done because medications can be given to help prevent your baby from being born with this infection should you have been infected without knowing it. Blood work is also used to find out your blood type, previous infections and follow your blood levels (hemoglobin).  Low hemoglobin (anemia) is common during pregnancy. Iron and vitamins are given to help prevent this. Later in the pregnancy,  blood tests for diabetes will be done along with any other tests if any problems develop. You may need tests to make sure you and the baby are doing well.  You may need other tests to make sure you and the baby are doing well. CHANGES DURING THE FIRST TRIMESTER (THE FIRST 3 MONTHS OF PREGNANCY) Your body goes through many changes during pregnancy. They vary from person to person. Talk to your caregiver about changes you notice and are concerned about. Changes can include:  Your menstrual period stops.  The egg and sperm carry the genes that determine what you look like. Genes from you and your partner are forming a baby. The female genes determine whether the baby is a boy or a girl.  Your body increases in girth and you may feel bloated.  Feeling sick to your stomach (nauseous) and throwing up (vomiting). If the vomiting is uncontrollable, call your caregiver.  Your breasts will begin to enlarge and become tender.  Your nipples may stick out more and become darker.  The need to urinate more. Painful urination may mean you have a bladder infection.  Tiring easily.  Loss of appetite.  Cravings for certain kinds of food.  At first, you may gain or lose a couple of pounds.  You may have changes in your emotions from day to day (excited to be pregnant or concerned something may go wrong with the pregnancy and baby).  You may have more vivid and strange dreams. HOME CARE INSTRUCTIONS   It is very important  to avoid all smoking, alcohol and un-prescribed drugs during your pregnancy. These affect the formation and growth of the baby. Avoid chemicals while pregnant to ensure the delivery of a healthy infant.  Start your prenatal visits by the 12th week of pregnancy. They are usually scheduled monthly at first, then more often in the last 2 months before delivery. Keep your caregiver's appointments. Follow your caregiver's instructions regarding medication use, blood and lab tests, exercise,  and diet.  During pregnancy, you are providing food for you and your baby. Eat regular, well-balanced meals. Choose foods such as meat, fish, milk and other low fat dairy products, vegetables, fruits, and whole-grain breads and cereals. Your caregiver will tell you of the ideal weight gain.  You can help morning sickness by keeping soda crackers at the bedside. Eat a couple before arising in the morning. You may want to use the crackers without salt on them.  Eating 4 to 5 small meals rather than 3 large meals a day also may help the nausea and vomiting.  Drinking liquids between meals instead of during meals also seems to help nausea and vomiting.  A physical sexual relationship may be continued throughout pregnancy if there are no other problems. Problems may be early (premature) leaking of amniotic fluid from the membranes, vaginal bleeding, or belly (abdominal) pain.  Exercise regularly if there are no restrictions. Check with your caregiver or physical therapist if you are unsure of the safety of some of your exercises. Greater weight gain will occur in the last 2 trimesters of pregnancy. Exercising will help:  Control your weight.  Keep you in shape.  Prepare you for labor and delivery.  Help you lose your pregnancy weight after you deliver your baby.  Wear a good support or jogging bra for breast tenderness during pregnancy. This may help if worn during sleep too.  Ask when prenatal classes are available. Begin classes when they are offered.  Do not use hot tubs, steam rooms or saunas.  Wear your seat belt when driving. This protects you and your baby if you are in an accident.  Avoid raw meat, uncooked cheese, cat litter boxes and soil used by cats throughout the pregnancy. These carry germs that can cause birth defects in the baby.  The first trimester is a good time to visit your dentist for your dental health. Getting your teeth cleaned is OK. Use a softer toothbrush and  brush gently during pregnancy.  Ask for help if you have financial, counseling or nutritional needs during pregnancy. Your caregiver will be able to offer counseling for these needs as well as refer you for other special needs.  Do not take any medications or herbs unless told by your caregiver.  Inform your caregiver if there is any mental or physical domestic violence.  Make a list of emergency phone numbers of family, friends, hospital, and police and fire departments.  Write down your questions. Take them to your prenatal visit.  Do not douche.  Do not cross your legs.  If you have to stand for long periods of time, rotate you feet or take small steps in a circle.  You may have more vaginal secretions that may require a sanitary pad. Do not use tampons or scented sanitary pads. MEDICATIONS AND DRUG USE IN PREGNANCY  Take prenatal vitamins as directed. The vitamin should contain 1 milligram of folic acid. Keep all vitamins out of reach of children. Only a couple vitamins or tablets containing iron may be  fatal to a baby or young child when ingested.  Avoid use of all medications, including herbs, over-the-counter medications, not prescribed or suggested by your caregiver. Only take over-the-counter or prescription medicines for pain, discomfort, or fever as directed by your caregiver. Do not use aspirin, ibuprofen, or naproxen unless directed by your caregiver.  Let your caregiver also know about herbs you may be using.  Alcohol is related to a number of birth defects. This includes fetal alcohol syndrome. All alcohol, in any form, should be avoided completely. Smoking will cause low birth rate and premature babies.  Street or illegal drugs are very harmful to the baby. They are absolutely forbidden. A baby born to an addicted mother will be addicted at birth. The baby will go through the same withdrawal an adult does.  Let your caregiver know about any medications that you have to  take and for what reason you take them. MISCARRIAGE IS COMMON DURING PREGNANCY A miscarriage does not mean you did something wrong. It is not a reason to worry about getting pregnant again. Your caregiver will help you with questions you may have. If you have a miscarriage, you may need minor surgery. SEEK MEDICAL CARE IF:  You have any concerns or worries during your pregnancy. It is better to call with your questions if you feel they cannot wait, rather than worry about them. SEEK IMMEDIATE MEDICAL CARE IF:   An unexplained oral temperature above 102 F (38.9 C) develops, or as your caregiver suggests.  You have leaking of fluid from the vagina (birth canal). If leaking membranes are suspected, take your temperature and inform your caregiver of this when you call.  There is vaginal spotting or bleeding. Notify your caregiver of the amount and how many pads are used.  You develop a bad smelling vaginal discharge with a change in the color.  You continue to feel sick to your stomach (nauseated) and have no relief from remedies suggested. You vomit blood or coffee ground-like materials.  You lose more than 2 pounds of weight in 1 week.  You gain more than 2 pounds of weight in 1 week and you notice swelling of your face, hands, feet, or legs.  You gain 5 pounds or more in 1 week (even if you do not have swelling of your hands, face, legs, or feet).  You get exposed to [place] measles and have never had them.  You are exposed to fifth disease or chickenpox.  You develop belly (abdominal) pain. Round ligament discomfort is a common non-cancerous (benign) cause of abdominal pain in pregnancy. Your caregiver still must evaluate this.  You develop headache, fever, diarrhea, pain with urination, or shortness of breath.  You fall or are in a car accident or have any kind of trauma.  There is mental or physical violence in your home. Document Released: 10/01/2001 Document Revised: 12/30/2011  Document Reviewed: 04/04/2009 [facility] Patient Information 2013 [place], [place].  Gestational Diabetes Mellitus Gestational diabetes mellitus (GDM) is diabetes that occurs only during pregnancy. This happens when the body cannot properly handle the glucose (sugar) that increases in the blood after eating. During pregnancy, insulin resistance (reduced sensitivity to insulin) occurs because of the release of hormones from the placenta. Usually, the pancreas of pregnant women produces enough insulin to overcome the resistance that occurs. However, in gestational diabetes, the insulin is there but it does not work effectively. If the resistance is severe enough that the pancreas does not produce enough insulin, extra glucose builds  up in the blood.  WHO IS AT RISK FOR DEVELOPING GESTATIONAL DIABETES?  Women with a history of diabetes in the family.  Women over age 82.  Women who are overweight.  Women in certain ethnic groups (Hispanic, African American, Native American, [place] and [place] Islander). WHAT CAN HAPPEN TO THE BABY? If the mother's blood glucose is too high while she is pregnant, the extra sugar will travel through the umbilical cord to the baby. Some of the problems the baby may have are:  Large Baby - If the baby receives too much sugar, the baby will gain more weight. This may cause the baby to be too large to be born normally (vaginally) and a Cesarean section (C-section) may be needed.  Low Blood Glucose (hypoglycemia)  The baby makes extra insulin, in response to the extra sugar its gets from its mother. When the baby is born and no longer needs this extra insulin, the baby's blood glucose level may drop.  Jaundice (yellow coloring of the skin and eyes)  This is fairly common in babies. It is caused from a build-up of the chemical called bilirubin. This is rarely serious, but is seen more often in babies whose mothers had gestational diabetes. RISKS TO THE MOTHER Women who have  had gestational diabetes may be at higher risk for some problems, including:  Preeclampsia or toxemia, which includes problems with high blood pressure. Blood pressure and protein levels in the urine must be checked frequently.  Infections.  Cesarean section (C-section) for delivery.  Developing Type 2 diabetes later in life. About 30-50% will develop diabetes later, especially if obese. DIAGNOSIS  The hormones that cause insulin resistance are highest at about 24-28 weeks of pregnancy. If symptoms are experienced, they are much like symptoms you would normally expect during pregnancy.  GDM is often diagnosed using a two part method: 1. After 24-28 weeks of pregnancy, the woman drinks a glucose solution and takes a blood test. If the glucose level is high, a second test will be given. 2. Oral Glucose Tolerance Test (OGTT) which is 3 hours long  After not eating overnight, the blood glucose is checked. The woman drinks a glucose solution, and hourly blood glucose tests are taken. If the woman has risk factors for GDM, the caregiver may test earlier than 24 weeks of pregnancy. TREATMENT  Treatment of GDM is directed at keeping the mother's blood glucose level normal, and may include:  Meal planning.  Taking insulin or other medicine to control your blood glucose level.  Exercise.  Keeping a daily record of the foods you eat.  Blood glucose monitoring and keeping a record of your blood glucose levels.  May monitor ketone levels in the urine, although this is no longer considered necessary in most pregnancies. HOME CARE INSTRUCTIONS  While you are pregnant:  Follow your caregiver's advice regarding your prenatal appointments, meal planning, exercise, medicines, vitamins, blood and other tests, and physical activities.  Keep a record of your meals, blood glucose tests, and the amount of insulin you are taking (if any). Show this to your caregiver at every prenatal visit.  If you have  GDM, you may have problems with hypoglycemia (low blood glucose). You may suspect this if you become suddenly dizzy, feel shaky, and/or weak. If you think this is happening and you have a glucose meter, try to test your blood glucose level. Follow your caregiver's advice for when and how to treat your low blood glucose. Generally, the 15:15 rule is  followed: Treat by consuming 15 grams of carbohydrates, wait 15 minutes, and recheck blood glucose. Examples of 15 grams of carbohydrates are:  1 cup skim or low-fat milk.   cup juice.  3-4 glucose tablets.  5-6 hard candies.  1 small box raisins.   cup regular soda pop.  Practice good hygiene, to avoid infections.  Do not smoke. SEEK MEDICAL CARE IF:   You develop abnormal vaginal discharge, with or without itching.  You become weak and tired more than expected.  You seem to sweat a lot.  You have a sudden increase in weight, 5 pounds or more in one week.  You are losing weight, 3 pounds or more in a week.  Your blood glucose level is high, and you need instructions on what to do about it. SEEK IMMEDIATE MEDICAL CARE IF:   You develop a severe headache.  You faint or pass out.  You develop nausea and vomiting.  You become disoriented or confused.  You have a convulsion.  You develop vision problems.  You develop stomach pain.  You develop vaginal bleeding.  You develop uterine contractions.  You have leaking or a gush of fluid from the vagina. AFTER YOU HAVE THE BABY:  Go to all of your follow-up appointments, and have blood tests as advised by your caregiver.  Maintain a healthy lifestyle, to prevent diabetes in the future. This includes:  Following a healthy meal plan.  Controlling your weight.  Getting enough exercise and proper rest.  Do not smoke.  Breastfeed your baby if you can. This will lower the chance of you and your baby developing diabetes later in life. For more information about diabetes,  go to the American Diabetes Association at: PMFashions.com.cy. For more information about gestational diabetes, go to the [employer] at: RentRule.com.au. Document Released: 01/13/2001 Document Revised: 12/30/2011 Document Reviewed: 08/07/2009 [facility] Patient Information 2013 [place], [place].

## 2012-11-30 NOTE — Progress Notes (Signed)
Subjective:    Jaime Morrow is a [id number] [redacted]w[redacted]d being seen today for her first obstetrical visit.  Her obstetrical history is significant for advanced maternal age, obesity and pregestational diabetes and grand multiparity. Patient does intend to breast feed. Pregnancy history fully reviewed.  Patient reports no complaints.  Has been checking fasting and 2 hour PP blood sugars - all fastings elevated (121-154), have decreased into 120s since starting very low carb diet, but remain consistently elevated. Postprandials are mostly less than 120, all but 1 WNL since starting diet.   Filed Vitals:   11/30/12 1001  BP: 125/80  Weight: 225 lb (102.059 kg)    HISTORY: OB History   Grav Para Term Preterm Abortions TAB SAB Ect Mult Living   11 8 8  2  2   8      # Outc Date GA Lbr Len/2nd Wgt Sex Del Anes PTL Lv   1 TRM 10/99 [redacted]w[redacted]d  8lb2oz(3.685kg) M SVD EPI No Yes   Comments: Dry birth   2 TRM 6/01 [redacted]w[redacted]d  8lb2oz(3.685kg) F SVD EPI No Yes   3 TRM 1/04 [redacted]w[redacted]d   F SVD EPI No Yes   4 SAB 4/05           Comments: D & C  @ 13 wks   5 SAB 7/05           Comments: D&C @ 10 wks/ Blood transfusion   6 TRM 8/06 [redacted]w[redacted]d  7lb15oz(3.6kg) F SVD EPI No Yes   Comments: Gestational diabetes   7 TRM 12/08 [redacted]w[redacted]d  8lb4oz(3.742kg) F SVD EPI No Yes   8 TRM 5/10 [redacted]w[redacted]d  8lb1oz(3.657kg) M SVD EPI No Yes   Comments: Pre eclampsia   9 TRM 12/11 [redacted]w[redacted]d  7lb9oz(3.43kg) F SVD EPI No Yes   10 TRM 5/13 [redacted]w[redacted]d 06:15 / 00:15 7lb5.8oz(3.34kg) M VAC EPI  Yes   11 CUR              Past Medical History  Diagnosis Date  . Anemia   . Asthma   . Blood transfusion abn reaction or complication, no procedure mishap   . 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 Gestational diabetes     insulin and metformin  . Pregnancy induced hypertension     last pregnancy   Past Surgical History  Procedure Laterality Date  . Dilation and curettage of uterus  2005 x 2  . Lumbar laminectomy  2007   Family  History  Problem Relation Age of Onset  . COPD Maternal Grandmother   . Diabetes type II Maternal Grandmother   . Rheum arthritis Maternal Grandmother   . Cancer Maternal Grandfather     colon  . Alcohol abuse Maternal Grandfather   . Alcohol abuse Paternal Grandfather      Exam    Uterus:   below SP  Pelvic Exam: deferred  System:     Skin: normal coloration and turgor, no rashes    Neurologic: oriented, normal   Extremities: normal strength, tone, and muscle mass       Mouth/Teeth mucous membranes moist, pharynx normal without lesions   Neck supple and no masses   Cardiovascular: regular rate and rhythm   Respiratory:  appears well, vitals normal, no respiratory distress, acyanotic, normal RR   Abdomen: soft, non-tender; bowel sounds normal; no masses,  no organomegaly     Assessment:    Pregnancy: [id number] Patient Active Problem List  Diagnosis  . Asthma  .  Supervision of high-risk pregnancy  . Preexisting diabetes complicating pregnancy, antepartum        Plan:     Initial labs draw on 12/4, results rev'd Start Metformin 500 mg bid Prenatal vitamins. Problem list reviewed and updated. Genetic Screening discussed : declined.  Ultrasound discussed; fetal survey: at 19-20 weeks.  Follow up in 2 weeks.   Lavaris Sexson 11/30/2012

## 2012-11-30 NOTE — Progress Notes (Signed)
p-91  Last pap 11/12

## 2012-12-23 ENCOUNTER — Ambulatory Visit (INDEPENDENT_AMBULATORY_CARE_PROVIDER_SITE_OTHER): Payer: Self-pay | Admitting: Obstetrics & Gynecology

## 2012-12-23 VITALS — BP 110/68 | Wt 230.0 lb

## 2012-12-23 DIAGNOSIS — O2432 Unspecified pre-existing diabetes mellitus in childbirth: Secondary | ICD-10-CM

## 2012-12-23 DIAGNOSIS — Z3480 Encounter for supervision of other normal pregnancy, unspecified trimester: Secondary | ICD-10-CM

## 2012-12-23 DIAGNOSIS — O24912 Unspecified diabetes mellitus in pregnancy, second trimester: Secondary | ICD-10-CM

## 2012-12-23 NOTE — Progress Notes (Signed)
Pt's fastings are still in the 90s to low 100s.  Will start glyburide 2.5 mg qhs.  Continue metformin 500 bid.  Declines genetic testing.

## 2012-12-23 NOTE — Progress Notes (Signed)
p-88  Discuss the dosage of the way she has been taking her Metformin

## 2012-12-28 ENCOUNTER — Other Ambulatory Visit: Payer: Self-pay | Admitting: Obstetrics & Gynecology

## 2012-12-28 ENCOUNTER — Telehealth: Payer: Self-pay

## 2012-12-28 MED ORDER — GLYBURIDE 2.5 MG PO TABS: 2.5000 mg | ORAL_TABLET | Freq: Every day | ORAL | Status: DC

## 2012-12-28 NOTE — Telephone Encounter (Signed)
error 

## 2013-01-01 ENCOUNTER — Ambulatory Visit (INDEPENDENT_AMBULATORY_CARE_PROVIDER_SITE_OTHER): Payer: Self-pay | Admitting: Advanced Practice Midwife

## 2013-01-01 ENCOUNTER — Telehealth: Payer: Self-pay | Admitting: Advanced Practice Midwife

## 2013-01-01 VITALS — BP 130/75 | Wt 232.0 lb

## 2013-01-01 DIAGNOSIS — O24312 Unspecified pre-existing diabetes mellitus in pregnancy, second trimester: Secondary | ICD-10-CM

## 2013-01-01 DIAGNOSIS — Z3480 Encounter for supervision of other normal pregnancy, unspecified trimester: Secondary | ICD-10-CM

## 2013-01-01 DIAGNOSIS — O2432 Unspecified pre-existing diabetes mellitus in childbirth: Secondary | ICD-10-CM

## 2013-01-01 NOTE — Telephone Encounter (Signed)
Informed no need to increase Glyburide now. F/U as scheduled in 3 weeks. Bring CBG log.   Dorathy Kinsman, CNM 01/01/2013 12:27 PM

## 2013-01-01 NOTE — Patient Instructions (Signed)
Gestational Diabetes Mellitus Gestational diabetes mellitus (GDM) is diabetes that occurs only during pregnancy. This happens when the body cannot properly handle the glucose (sugar) that increases in the blood after eating. During pregnancy, insulin resistance (reduced sensitivity to insulin) occurs because of the release of hormones from the placenta. Usually, the pancreas of pregnant women produces enough insulin to overcome the resistance that occurs. However, in gestational diabetes, the insulin is there but it does not work effectively. If the resistance is severe enough that the pancreas does not produce enough insulin, extra glucose builds up in the blood.  WHO IS AT RISK FOR DEVELOPING GESTATIONAL DIABETES?  Women with a history of diabetes in the family.  Women over age 25.  Women who are overweight.  Women in certain ethnic groups (Hispanic, African American, Native American, Asian and Pacific Islander). WHAT CAN HAPPEN TO THE BABY? If the mother's blood glucose is too high while she is pregnant, the extra sugar will travel through the umbilical cord to the baby. Some of the problems the baby may have are:  Large Baby - If the baby receives too much sugar, the baby will gain more weight. This may cause the baby to be too large to be born normally (vaginally) and a Cesarean section (C-section) may be needed.  Low Blood Glucose (hypoglycemia)  The baby makes extra insulin, in response to the extra sugar its gets from its mother. When the baby is born and no longer needs this extra insulin, the baby's blood glucose level may drop.  Jaundice (yellow coloring of the skin and eyes)  This is fairly common in babies. It is caused from a build-up of the chemical called bilirubin. This is rarely serious, but is seen more often in babies whose mothers had gestational diabetes. RISKS TO THE MOTHER Women who have had gestational diabetes may be at higher risk for some problems,  including:  Preeclampsia or toxemia, which includes problems with high blood pressure. Blood pressure and protein levels in the urine must be checked frequently.  Infections.  Cesarean section (C-section) for delivery.  Developing Type 2 diabetes later in life. About 30-50% will develop diabetes later, especially if obese. DIAGNOSIS  The hormones that cause insulin resistance are highest at about 24-28 weeks of pregnancy. If symptoms are experienced, they are much like symptoms you would normally expect during pregnancy.  GDM is often diagnosed using a two part method: 1. After 24-28 weeks of pregnancy, the woman drinks a glucose solution and takes a blood test. If the glucose level is high, a second test will be given. 2. Oral Glucose Tolerance Test (OGTT) which is 3 hours long  After not eating overnight, the blood glucose is checked. The woman drinks a glucose solution, and hourly blood glucose tests are taken. If the woman has risk factors for GDM, the caregiver may test earlier than 24 weeks of pregnancy. TREATMENT  Treatment of GDM is directed at keeping the mother's blood glucose level normal, and may include:  Meal planning.  Taking insulin or other medicine to control your blood glucose level.  Exercise.  Keeping a daily record of the foods you eat.  Blood glucose monitoring and keeping a record of your blood glucose levels.  May monitor ketone levels in the urine, although this is no longer considered necessary in most pregnancies. HOME CARE INSTRUCTIONS  While you are pregnant:  Follow your caregiver's advice regarding your prenatal appointments, meal planning, exercise, medicines, vitamins, blood and other tests, and physical   activities.  Keep a record of your meals, blood glucose tests, and the amount of insulin you are taking (if any). Show this to your caregiver at every prenatal visit.  If you have GDM, you may have problems with hypoglycemia (low blood glucose).  You may suspect this if you become suddenly dizzy, feel shaky, and/or weak. If you think this is happening and you have a glucose meter, try to test your blood glucose level. Follow your caregiver's advice for when and how to treat your low blood glucose. Generally, the 15:15 rule is followed: Treat by consuming 15 grams of carbohydrates, wait 15 minutes, and recheck blood glucose. Examples of 15 grams of carbohydrates are:  1 cup skim or low-fat milk.   cup juice.  3-4 glucose tablets.  5-6 hard candies.  1 small box raisins.   cup regular soda pop.  Practice good hygiene, to avoid infections.  Do not smoke. SEEK MEDICAL CARE IF:   You develop abnormal vaginal discharge, with or without itching.  You become weak and tired more than expected.  You seem to sweat a lot.  You have a sudden increase in weight, 5 pounds or more in one week.  You are losing weight, 3 pounds or more in a week.  Your blood glucose level is high, and you need instructions on what to do about it. SEEK IMMEDIATE MEDICAL CARE IF:   You develop a severe headache.  You faint or pass out.  You develop nausea and vomiting.  You become disoriented or confused.  You have a convulsion.  You develop vision problems.  You develop stomach pain.  You develop vaginal bleeding.  You develop uterine contractions.  You have leaking or a gush of fluid from the vagina. AFTER YOU HAVE THE BABY:  Go to all of your follow-up appointments, and have blood tests as advised by your caregiver.  Maintain a healthy lifestyle, to prevent diabetes in the future. This includes:  Following a healthy meal plan.  Controlling your weight.  Getting enough exercise and proper rest.  Do not smoke.  Breastfeed your baby if you can. This will lower the chance of you and your baby developing diabetes later in life. For more information about diabetes, go to the American Diabetes Association at:  www.americandiabetesassociation.org. For more information about gestational diabetes, go to the American Congress of Obstetricians and Gynecologists at: www.acog.org. Document Released: 01/13/2001 Document Revised: 12/30/2011 Document Reviewed: 08/07/2009 Exit[employer] Patient Information 2013 [employer].  

## 2013-01-01 NOTE — Progress Notes (Signed)
Fasting 89-101 (mostly 80's-low 90's), 2 hour PC 68-138 (rarely >120). Will leave Glyburide alone for now.

## 2013-01-01 NOTE — Progress Notes (Signed)
P-94 - Pt came in for follow up since she started glyburide. She didn't need to see provider today but I documented vital signs and patient stated she was doing okay other than feeling like the glyburide needed to be increased. I will give the blood sugar log to [facility] [place] for review.

## 2013-01-15 ENCOUNTER — Encounter: Payer: Self-pay | Admitting: Family

## 2013-01-18 ENCOUNTER — Encounter: Payer: Self-pay | Admitting: Advanced Practice Midwife

## 2013-01-21 ENCOUNTER — Encounter: Payer: Self-pay | Admitting: Advanced Practice Midwife

## 2013-01-22 ENCOUNTER — Ambulatory Visit (HOSPITAL_COMMUNITY)
Admission: RE | Admit: 2013-01-22 | Discharge: 2013-01-22 | Disposition: A | Discharge status: Home / Self Care | Payer: Medicaid Other | Source: Ambulatory Visit | Attending: Obstetrics & Gynecology | Admitting: Obstetrics & Gynecology

## 2013-01-22 DIAGNOSIS — O358XX0 Maternal care for other (suspected) fetal abnormality and damage, not applicable or unspecified: Secondary | ICD-10-CM | POA: Insufficient documentation

## 2013-01-22 DIAGNOSIS — Z363 Encounter for antenatal screening for malformations: Secondary | ICD-10-CM | POA: Insufficient documentation

## 2013-01-22 DIAGNOSIS — Z1389 Encounter for screening for other disorder: Secondary | ICD-10-CM | POA: Insufficient documentation

## 2013-01-22 DIAGNOSIS — O24912 Unspecified diabetes mellitus in pregnancy, second trimester: Secondary | ICD-10-CM

## 2013-01-22 DIAGNOSIS — O99810 Abnormal glucose complicating pregnancy: Secondary | ICD-10-CM | POA: Insufficient documentation

## 2013-01-25 ENCOUNTER — Encounter: Payer: Self-pay | Admitting: Family

## 2013-01-26 ENCOUNTER — Encounter: Payer: Self-pay | Admitting: Obstetrics & Gynecology

## 2013-01-26 ENCOUNTER — Ambulatory Visit (HOSPITAL_COMMUNITY): Payer: MEDICAID

## 2013-02-09 ENCOUNTER — Ambulatory Visit (INDEPENDENT_AMBULATORY_CARE_PROVIDER_SITE_OTHER): Payer: Medicaid Other | Admitting: Obstetrics & Gynecology

## 2013-02-09 ENCOUNTER — Encounter: Payer: Self-pay | Admitting: Obstetrics & Gynecology

## 2013-02-09 VITALS — BP 148/86 | Wt 239.0 lb

## 2013-02-09 DIAGNOSIS — E669 Obesity, unspecified: Secondary | ICD-10-CM

## 2013-02-09 DIAGNOSIS — O09522 Supervision of elderly multigravida, second trimester: Secondary | ICD-10-CM

## 2013-02-09 DIAGNOSIS — O99210 Obesity complicating pregnancy, unspecified trimester: Secondary | ICD-10-CM

## 2013-02-09 DIAGNOSIS — O24312 Unspecified pre-existing diabetes mellitus in pregnancy, second trimester: Secondary | ICD-10-CM

## 2013-02-09 DIAGNOSIS — O0990 Supervision of high risk pregnancy, unspecified, unspecified trimester: Secondary | ICD-10-CM

## 2013-02-09 DIAGNOSIS — O24919 Unspecified diabetes mellitus in pregnancy, unspecified trimester: Secondary | ICD-10-CM

## 2013-02-09 DIAGNOSIS — O0992 Supervision of high risk pregnancy, unspecified, second trimester: Secondary | ICD-10-CM

## 2013-02-09 DIAGNOSIS — O09529 Supervision of elderly multigravida, unspecified trimester: Secondary | ICD-10-CM | POA: Insufficient documentation

## 2013-02-09 NOTE — Progress Notes (Signed)
P - 108 - Pt states she has had numbness in hands and feet - she also has complaints of depression, feeling sluggish, and tired  - Pt states she has to concentrate on feeling the baby move before she can feel a flutter

## 2013-02-09 NOTE — Progress Notes (Signed)
Routine visit. Good FM. Feeling tired, somewhat depressed (NO HI/SI, some constipation. I will check TSH today (Her mother has thyroid disease). Her fastings are creeping up (90s-110s). She denies HAs, RUQ pain. I will increase her glyburide to 5 mg qhs. We may need to increase her metformin, but I will see what effect the increase in glyburide has. I will get a baseline 24 hour urine and CMETA. She again declines genetic screening. She does not want birth control.

## 2013-02-10 LAB — COMPREHENSIVE METABOLIC PANEL
ALT: 11 U/L (ref 0–35)
AST: 11 U/L (ref 0–37)
Alkaline Phosphatase: 61 U/L (ref 39–117)
Sodium: 137 mEq/L (ref 135–145)
Total Bilirubin: 0.3 mg/dL (ref 0.3–1.2)
Total Protein: 6.3 g/dL (ref 6.0–8.3)

## 2013-02-10 LAB — TSH: TSH: 1.806 u[IU]/mL (ref 0.350–4.500)

## 2013-02-17 ENCOUNTER — Ambulatory Visit (INDEPENDENT_AMBULATORY_CARE_PROVIDER_SITE_OTHER): Payer: Self-pay | Admitting: Obstetrics & Gynecology

## 2013-02-17 ENCOUNTER — Encounter: Payer: Self-pay | Admitting: Obstetrics & Gynecology

## 2013-02-17 VITALS — BP 122/67 | Temp 97.0°F | Wt 236.0 lb

## 2013-02-17 DIAGNOSIS — O24312 Unspecified pre-existing diabetes mellitus in pregnancy, second trimester: Secondary | ICD-10-CM

## 2013-02-17 DIAGNOSIS — E669 Obesity, unspecified: Secondary | ICD-10-CM

## 2013-02-17 DIAGNOSIS — O0990 Supervision of high risk pregnancy, unspecified, unspecified trimester: Secondary | ICD-10-CM

## 2013-02-17 DIAGNOSIS — Z641 Problems related to multiparity: Secondary | ICD-10-CM | POA: Insufficient documentation

## 2013-02-17 DIAGNOSIS — O99210 Obesity complicating pregnancy, unspecified trimester: Secondary | ICD-10-CM

## 2013-02-17 DIAGNOSIS — O2432 Unspecified pre-existing diabetes mellitus in childbirth: Secondary | ICD-10-CM

## 2013-02-17 DIAGNOSIS — O0992 Supervision of high risk pregnancy, unspecified, second trimester: Secondary | ICD-10-CM

## 2013-02-17 DIAGNOSIS — O09522 Supervision of elderly multigravida, second trimester: Secondary | ICD-10-CM

## 2013-02-17 DIAGNOSIS — O09529 Supervision of elderly multigravida, unspecified trimester: Secondary | ICD-10-CM | POA: Insufficient documentation

## 2013-02-17 MED ORDER — METFORMIN HCL 500 MG PO TABS: 1000.0000 mg | ORAL_TABLET | Freq: Two times a day (BID) | ORAL | Status: DC

## 2013-02-17 MED ORDER — GLYBURIDE 2.5 MG PO TABS: 5.0000 mg | ORAL_TABLET | Freq: Every day | ORAL | Status: DC

## 2013-02-17 NOTE — Progress Notes (Signed)
Routine visit. Good FM. Her u/s 01-22-13 showed 25% growth, so I think that the fundal height measurement is skewed by her panus. I will reorder an u/s as recommended at the time of her anatomy scan. All of her  sugars are still elevated so I will increase metformin to 1000 BID.

## 2013-02-18 ENCOUNTER — Telehealth: Payer: Self-pay | Admitting: *Deleted

## 2013-02-18 DIAGNOSIS — E119 Type 2 diabetes mellitus without complications: Secondary | ICD-10-CM

## 2013-02-18 MED ORDER — METFORMIN HCL 1000 MG PO TABS: 1000.0000 mg | ORAL_TABLET | Freq: Two times a day (BID) | ORAL | Status: DC

## 2013-02-18 NOTE — Telephone Encounter (Signed)
Pharmacy needed clarification of Metformin RX that was sent by Dr Marice Potter.  She wanted pt to have 1000 mg BID instead of 500 mg tabs.

## 2013-02-23 LAB — PROTEIN, URINE, 24 HOUR
Protein, 24H Urine: 124 mg/d — ABNORMAL HIGH (ref 50–100)
Protein, Urine: 5 mg/dL

## 2013-02-24 ENCOUNTER — Encounter: Payer: Self-pay | Admitting: Obstetrics & Gynecology

## 2013-03-22 ENCOUNTER — Encounter: Payer: Self-pay | Admitting: Advanced Practice Midwife

## 2013-03-23 ENCOUNTER — Encounter: Payer: Self-pay | Admitting: Obstetrics & Gynecology

## 2013-03-23 ENCOUNTER — Ambulatory Visit (INDEPENDENT_AMBULATORY_CARE_PROVIDER_SITE_OTHER): Payer: Medicaid Other | Admitting: Obstetrics & Gynecology

## 2013-03-23 ENCOUNTER — Ambulatory Visit (HOSPITAL_COMMUNITY)
Admission: RE | Admit: 2013-03-23 | Discharge: 2013-03-23 | Disposition: A | Discharge status: Home / Self Care | Payer: Medicaid Other | Source: Ambulatory Visit | Attending: Obstetrics & Gynecology | Admitting: Obstetrics & Gynecology

## 2013-03-23 VITALS — BP 122/72 | Wt 238.0 lb

## 2013-03-23 DIAGNOSIS — Z3482 Encounter for supervision of other normal pregnancy, second trimester: Secondary | ICD-10-CM

## 2013-03-23 DIAGNOSIS — Z3480 Encounter for supervision of other normal pregnancy, unspecified trimester: Secondary | ICD-10-CM

## 2013-03-23 DIAGNOSIS — O99810 Abnormal glucose complicating pregnancy: Secondary | ICD-10-CM | POA: Insufficient documentation

## 2013-03-23 DIAGNOSIS — Z3689 Encounter for other specified antenatal screening: Secondary | ICD-10-CM | POA: Insufficient documentation

## 2013-03-23 DIAGNOSIS — O24312 Unspecified pre-existing diabetes mellitus in pregnancy, second trimester: Secondary | ICD-10-CM

## 2013-03-23 NOTE — Progress Notes (Signed)
P - 97 

## 2013-03-23 NOTE — Progress Notes (Signed)
p-88 

## 2013-03-23 NOTE — Progress Notes (Signed)
Routine OB visit. Good FM. Reasonable sugars. No OB problems. Normal u/s today. TDAP, labs today.

## 2013-03-24 LAB — CBC
Hemoglobin: 12.3 g/dL (ref 12.0–15.0)
MCH: 29.4 pg (ref 26.0–34.0)
MCHC: 33.2 g/dL (ref 30.0–36.0)

## 2013-03-24 LAB — RPR

## 2013-04-07 ENCOUNTER — Encounter: Payer: Self-pay | Admitting: Obstetrics & Gynecology

## 2013-04-09 ENCOUNTER — Ambulatory Visit (INDEPENDENT_AMBULATORY_CARE_PROVIDER_SITE_OTHER): Payer: Self-pay | Admitting: Advanced Practice Midwife

## 2013-04-09 VITALS — BP 136/74 | Wt 237.0 lb

## 2013-04-09 DIAGNOSIS — O24313 Unspecified pre-existing diabetes mellitus in pregnancy, third trimester: Secondary | ICD-10-CM

## 2013-04-09 DIAGNOSIS — O0990 Supervision of high risk pregnancy, unspecified, unspecified trimester: Secondary | ICD-10-CM

## 2013-04-09 DIAGNOSIS — O09529 Supervision of elderly multigravida, unspecified trimester: Secondary | ICD-10-CM

## 2013-04-09 DIAGNOSIS — O09523 Supervision of elderly multigravida, third trimester: Secondary | ICD-10-CM

## 2013-04-09 DIAGNOSIS — O0993 Supervision of high risk pregnancy, unspecified, third trimester: Secondary | ICD-10-CM

## 2013-04-09 DIAGNOSIS — O24919 Unspecified diabetes mellitus in pregnancy, unspecified trimester: Secondary | ICD-10-CM

## 2013-04-09 NOTE — Progress Notes (Signed)
Ketones- mod  p-101

## 2013-04-09 NOTE — Patient Instructions (Signed)
Pregnancy - Third Trimester  The third trimester begins at the 28th week of pregnancy and ends at birth. It is important to follow your doctor's instructions.  HOME CARE    Go to your doctor's visits.   Do not smoke.   Do not drink alcohol or use drugs.   Only take medicine as told by your doctor.   Take prenatal vitamins as told. The vitamin should contain 1 milligram of folic acid.   Exercise.   Eat healthy foods. Eat regular, well-balanced meals.   You can have sex (intercourse) if there are no other problems with the pregnancy.   Do not use hot tubs, steam rooms, or saunas.   Wear a seat belt while driving.   Avoid raw meat, uncooked cheese, and litter boxes and soil used by cats.   Rest with your legs raised (elevated).   Make a list of emergency phone numbers. Keep this list with you.   Arrange for help when you come back home after delivering the baby.   Make a trial run to the hospital.   Take prenatal classes.   Prepare the baby's nursery.   Do not travel out of the city. If you absolutely have to, get permission from your doctor first.   Wear flat shoes. Do not wear high heels.  GET HELP RIGHT AWAY IF:    You have a temperature by mouth above 102 F (38.9 C), not controlled by medicine.   You have not felt the baby move for more than 1 hour. If you think the baby is not moving as much as normal, eat something with sugar in it or lie down on your left side for an hour. The baby should move at least 4 to 5 times per hour.   Fluid is coming from the vagina.   Blood is coming from the vagina. Light spotting is common, especially after sex (intercourse).   You have belly (abdominal) pain.   You have a bad smelling fluid (discharge) coming from the vagina. The fluid changes from clear to white.   You still feel sick to your stomach (nauseous).   You throw up (vomit) for more than 24 hours.   You have the chills.   You have shortness of breath.   You have a burning feeling when you  pee (urinate).   You lose or gain more than 2 pounds (0.9 kilograms) of weight over a week, or as told by your doctor.   Your face, hands, feet, or legs get puffy (swell).   You have a bad headache that will not go away.   You start to have problems seeing (blurry or double vision).   You fall, are in a car accident, or have any kind of trauma.   There is mental or physical violence at home.   You have any concerns or worries during your pregnancy.  MAKE SURE YOU:    Understand these instructions.   Will watch your condition.   Will get help right away if you are not doing well or get worse.  Document Released: 01/01/2010 Document Revised: 12/30/2011 Document Reviewed: 01/01/2010  Exit[employer] Patient Information 2014 [employer].

## 2013-04-09 NOTE — Progress Notes (Signed)
Well, no c/o. Left glucose log at home, states sugars are doing good, "same as last time". Scheduled follow up u/s in 1 week, begin NSTs next week.

## 2013-04-14 ENCOUNTER — Ambulatory Visit (HOSPITAL_COMMUNITY)
Admission: RE | Admit: 2013-04-14 | Discharge: 2013-04-14 | Disposition: A | Discharge status: Home / Self Care | Payer: Medicaid Other | Source: Ambulatory Visit | Attending: Advanced Practice Midwife | Admitting: Advanced Practice Midwife

## 2013-04-14 DIAGNOSIS — O09299 Supervision of pregnancy with other poor reproductive or obstetric history, unspecified trimester: Secondary | ICD-10-CM | POA: Insufficient documentation

## 2013-04-14 DIAGNOSIS — O0993 Supervision of high risk pregnancy, unspecified, third trimester: Secondary | ICD-10-CM

## 2013-04-14 DIAGNOSIS — O99810 Abnormal glucose complicating pregnancy: Secondary | ICD-10-CM | POA: Insufficient documentation

## 2013-04-14 DIAGNOSIS — O09529 Supervision of elderly multigravida, unspecified trimester: Secondary | ICD-10-CM | POA: Insufficient documentation

## 2013-04-14 DIAGNOSIS — O24313 Unspecified pre-existing diabetes mellitus in pregnancy, third trimester: Secondary | ICD-10-CM

## 2013-04-14 DIAGNOSIS — E669 Obesity, unspecified: Secondary | ICD-10-CM | POA: Insufficient documentation

## 2013-04-14 DIAGNOSIS — O09523 Supervision of elderly multigravida, third trimester: Secondary | ICD-10-CM

## 2013-04-20 ENCOUNTER — Other Ambulatory Visit: Payer: Self-pay | Admitting: *Deleted

## 2013-04-20 DIAGNOSIS — O24419 Gestational diabetes mellitus in pregnancy, unspecified control: Secondary | ICD-10-CM

## 2013-04-20 MED ORDER — GLYBURIDE 2.5 MG PO TABS: ORAL_TABLET | ORAL | Status: DC

## 2013-04-20 NOTE — Telephone Encounter (Signed)
Pt called stating that her recent RX for Glyburide 2.5 mg was only a 15 day supply.  Pt's instructions were to take a total of 5 mg at bedtime but she was only given 30 tabs.  This was corrected and sent # [phone] to be dispensed.

## 2013-04-26 ENCOUNTER — Ambulatory Visit (INDEPENDENT_AMBULATORY_CARE_PROVIDER_SITE_OTHER): Payer: Self-pay | Admitting: Advanced Practice Midwife

## 2013-04-26 VITALS — BP 128/80 | Wt 238.0 lb

## 2013-04-26 DIAGNOSIS — O24313 Unspecified pre-existing diabetes mellitus in pregnancy, third trimester: Secondary | ICD-10-CM

## 2013-04-26 DIAGNOSIS — O24919 Unspecified diabetes mellitus in pregnancy, unspecified trimester: Secondary | ICD-10-CM

## 2013-04-26 MED ORDER — NYSTATIN 100000 UNIT/GM EX CREA: TOPICAL_CREAM | Freq: Two times a day (BID) | CUTANEOUS | Status: DC

## 2013-04-26 MED ORDER — GLYBURIDE 5 MG PO TABS: 5.0000 mg | ORAL_TABLET | Freq: Two times a day (BID) | ORAL | Status: DC

## 2013-04-26 NOTE — Progress Notes (Signed)
p-91  Yeast under breast

## 2013-04-26 NOTE — Progress Notes (Signed)
NST today. Fastings blood sugars mostly WNL, some elevated in mid to low 90s. 2 hour post breakfast numbers are good, 130s after lunch and dinner. Will add glyburide 2.5 mg in AM, continue glyburide 5 mg in PM, watch carbs with meals.

## 2013-05-03 ENCOUNTER — Encounter: Payer: Self-pay | Admitting: Advanced Practice Midwife

## 2013-05-07 ENCOUNTER — Ambulatory Visit (INDEPENDENT_AMBULATORY_CARE_PROVIDER_SITE_OTHER): Payer: Self-pay | Admitting: Family

## 2013-05-07 VITALS — BP 119/71 | Wt 241.0 lb

## 2013-05-07 DIAGNOSIS — O24919 Unspecified diabetes mellitus in pregnancy, unspecified trimester: Secondary | ICD-10-CM

## 2013-05-07 DIAGNOSIS — O0993 Supervision of high risk pregnancy, unspecified, third trimester: Secondary | ICD-10-CM

## 2013-05-07 DIAGNOSIS — Z3480 Encounter for supervision of other normal pregnancy, unspecified trimester: Secondary | ICD-10-CM

## 2013-05-07 NOTE — Progress Notes (Signed)
NST-R; did not bring log, did not know she was also having an appointment; reports after adding Glyburide 2.5 mg in AM, PPs have decreased; will bring log next week.  Return on Monday for NST.  Growth [place] 6/25 78%.

## 2013-05-07 NOTE — Progress Notes (Signed)
p=91 

## 2013-05-10 ENCOUNTER — Ambulatory Visit (INDEPENDENT_AMBULATORY_CARE_PROVIDER_SITE_OTHER): Payer: Self-pay | Admitting: Advanced Practice Midwife

## 2013-05-10 ENCOUNTER — Encounter: Payer: Self-pay | Admitting: Advanced Practice Midwife

## 2013-05-10 VITALS — BP 112/71 | Wt 239.0 lb

## 2013-05-10 DIAGNOSIS — Z3480 Encounter for supervision of other normal pregnancy, unspecified trimester: Secondary | ICD-10-CM

## 2013-05-10 DIAGNOSIS — E119 Type 2 diabetes mellitus without complications: Secondary | ICD-10-CM

## 2013-05-10 DIAGNOSIS — O24313 Unspecified pre-existing diabetes mellitus in pregnancy, third trimester: Secondary | ICD-10-CM

## 2013-05-10 DIAGNOSIS — O24919 Unspecified diabetes mellitus in pregnancy, unspecified trimester: Secondary | ICD-10-CM

## 2013-05-10 NOTE — Patient Instructions (Signed)

## 2013-05-10 NOTE — Progress Notes (Signed)
p-88  Ketones- tr

## 2013-05-10 NOTE — Progress Notes (Signed)
  Doing well Filed Vitals:   05/10/13 1044  BP: 112/71    NST reactive  Discussed diet and blood sugar results. FBS mostly 70-90s 2hrBrk  91-131 (5 of 12 over 120) 2hrLnch  111-148 (10 of 14 over 120) 2hrDin   121-157 (all over 120, most 120s and low 130s)  On Metformin twice daily  On Glyburide 2.5 in am, 5 at hs  Will elevate morning dose to 5  -- warned about low BS, let us know  Will schedule 36 week growth [place]  Traveling next week, diet tips discussed

## 2013-05-25 ENCOUNTER — Ambulatory Visit (HOSPITAL_COMMUNITY)
Admission: RE | Admit: 2013-05-25 | Discharge: 2013-05-25 | Disposition: A | Discharge status: Home / Self Care | Payer: Medicaid Other | Source: Ambulatory Visit | Attending: Advanced Practice Midwife | Admitting: Advanced Practice Midwife

## 2013-05-25 DIAGNOSIS — E669 Obesity, unspecified: Secondary | ICD-10-CM | POA: Insufficient documentation

## 2013-05-25 DIAGNOSIS — O09529 Supervision of elderly multigravida, unspecified trimester: Secondary | ICD-10-CM | POA: Insufficient documentation

## 2013-05-25 DIAGNOSIS — O99810 Abnormal glucose complicating pregnancy: Secondary | ICD-10-CM | POA: Insufficient documentation

## 2013-05-25 DIAGNOSIS — O99210 Obesity complicating pregnancy, unspecified trimester: Secondary | ICD-10-CM | POA: Insufficient documentation

## 2013-05-25 DIAGNOSIS — E119 Type 2 diabetes mellitus without complications: Secondary | ICD-10-CM

## 2013-05-25 DIAGNOSIS — O09299 Supervision of pregnancy with other poor reproductive or obstetric history, unspecified trimester: Secondary | ICD-10-CM | POA: Insufficient documentation

## 2013-05-28 ENCOUNTER — Ambulatory Visit (INDEPENDENT_AMBULATORY_CARE_PROVIDER_SITE_OTHER): Payer: Self-pay | Admitting: Family

## 2013-05-28 VITALS — BP 138/87 | Wt 240.0 lb

## 2013-05-28 DIAGNOSIS — O0993 Supervision of high risk pregnancy, unspecified, third trimester: Secondary | ICD-10-CM

## 2013-05-28 DIAGNOSIS — O24313 Unspecified pre-existing diabetes mellitus in pregnancy, third trimester: Secondary | ICD-10-CM

## 2013-05-28 DIAGNOSIS — O0990 Supervision of high risk pregnancy, unspecified, unspecified trimester: Secondary | ICD-10-CM

## 2013-05-28 DIAGNOSIS — O24919 Unspecified diabetes mellitus in pregnancy, unspecified trimester: Secondary | ICD-10-CM

## 2013-05-28 NOTE — Progress Notes (Signed)
Pt here for NST due to Diabetes; NST Reactive 130's, +accel; discussed induction of labor at 39 wks; pt verbalizes the desire to not induce labor; reviewed concerns (increase risk for stillbirth, LGA). Did not bring blood glucose log, reports all wnl.  GBS and GC/CT collected today.  Pt to return on Monday for NST and to meet with MD on Wednesday to discuss.

## 2013-05-28 NOTE — Progress Notes (Signed)
P-89 

## 2013-05-30 LAB — CULTURE, BETA STREP (GROUP B ONLY)

## 2013-05-31 ENCOUNTER — Ambulatory Visit (INDEPENDENT_AMBULATORY_CARE_PROVIDER_SITE_OTHER): Payer: Self-pay | Admitting: *Deleted

## 2013-05-31 ENCOUNTER — Encounter: Payer: Self-pay | Admitting: Family

## 2013-05-31 ENCOUNTER — Other Ambulatory Visit: Payer: Self-pay | Admitting: Advanced Practice Midwife

## 2013-05-31 VITALS — BP 124/85 | Wt 241.0 lb

## 2013-05-31 DIAGNOSIS — O99810 Abnormal glucose complicating pregnancy: Secondary | ICD-10-CM

## 2013-05-31 DIAGNOSIS — O24419 Gestational diabetes mellitus in pregnancy, unspecified control: Secondary | ICD-10-CM

## 2013-05-31 NOTE — Progress Notes (Signed)
NST reactive today.  Pt will return on Wednesday to discuss induction status with Dr Shawnie Pons.

## 2013-06-02 ENCOUNTER — Ambulatory Visit (INDEPENDENT_AMBULATORY_CARE_PROVIDER_SITE_OTHER): Payer: Self-pay | Admitting: Family Medicine

## 2013-06-02 VITALS — BP 156/98 | Wt 242.0 lb

## 2013-06-02 DIAGNOSIS — O24919 Unspecified diabetes mellitus in pregnancy, unspecified trimester: Secondary | ICD-10-CM

## 2013-06-02 DIAGNOSIS — Z3480 Encounter for supervision of other normal pregnancy, unspecified trimester: Secondary | ICD-10-CM

## 2013-06-02 DIAGNOSIS — O24313 Unspecified pre-existing diabetes mellitus in pregnancy, third trimester: Secondary | ICD-10-CM

## 2013-06-02 NOTE — Progress Notes (Signed)
Pt. Declines IOL.  Reviewed risks associated with IUFD and DM and need for delivery.  States she will be induced at 40 wks.  Advised of current recommendations for delivery at 39 wks with class B DM.  Pt. Understands the risks.  Will consider.  Kick counts reviewed.  Advised of U/S findings.  She states that all babies have measured big in the past, but they are all small.  Measures large for dates.

## 2013-06-02 NOTE — Progress Notes (Signed)
Discuss induction opts   p-106

## 2013-06-02 NOTE — Progress Notes (Signed)
Took pt BP manually after visit with MD - reading 130/80 - adv Dr Shawnie Pons of updated reading.

## 2013-06-02 NOTE — Assessment & Plan Note (Signed)
Continue 2x/wk testing.  Delivery at 40 wks.

## 2013-06-02 NOTE — Patient Instructions (Signed)
Gestational Diabetes Mellitus Gestational diabetes mellitus, often simply referred to as gestational diabetes, is a type of diabetes that some women develop during pregnancy. In gestational diabetes, the pancreas does not make enough insulin (a hormone), the cells are less responsive to the insulin that is made (insulin resistance), or both.Normally, insulin moves sugars from food into the tissue cells. The tissue cells use the sugars for energy. The lack of insulin or the lack of normal response to insulin causes excess sugars to build up in the blood instead of going into the tissue cells. As a result, high blood sugar (hyperglycemia) develops. The effect of high sugar (glucose) levels can cause many complications.  RISK FACTORS You have an increased chance of developing gestational diabetes if you have a family history of diabetes and also have one or more of the following risk factors:  A body mass index over 30 (obesity).  A previous pregnancy with gestational diabetes.  An older age at the time of pregnancy. If blood glucose levels are kept in the normal range during pregnancy, women can have a healthy pregnancy. If your blood glucose levels are not well controlled, there may be risks to you, your unborn baby (fetus), your labor and delivery, or your newborn baby.  SYMPTOMS  If symptoms are experienced, they are much like symptoms you would normally expect during pregnancy. The symptoms of gestational diabetes include:   Increased thirst (polydipsia).  Increased urination (polyuria).  Increased urination during the night (nocturia).  Weight loss. This weight loss may be rapid.  Frequent, recurring infections.  Tiredness (fatigue).  Weakness.  Vision changes, such as blurred vision.  Fruity smell to your breath.  Abdominal pain. DIAGNOSIS Diabetes is diagnosed when blood glucose levels are increased. Your blood glucose level may be checked by one or more of the following  blood tests:  A fasting blood glucose test. You will not be allowed to eat for at least 8 hours before a blood sample is taken.  A random blood glucose test. Your blood glucose is checked at any time of the day regardless of when you ate.  A hemoglobin A1c blood glucose test. A hemoglobin A1c test provides information about blood glucose control over the previous 3 months.  An oral glucose tolerance test (OGTT). Your blood glucose is measured after you have not eaten (fasted) for 1 3 hours and then after you drink a glucose-containing beverage. Since the hormones that cause insulin resistance are highest at about 24 28 weeks of a pregnancy, an OGTT is usually performed during that time. If you have risk factors for gestational diabetes, your caregiver may test you for gestational diabetes earlier than 24 weeks of pregnancy. TREATMENT   You will need to take diabetes medicine or insulin daily to keep blood glucose levels in the desired range.  You will need to match insulin dosing with exercise and healthy food choices. The treatment goal is to maintain the before meal (preprandial), bedtime, and overnight blood glucose level at 60 99 mg/dL during pregnancy. The treatment goal is to further maintain peak after meal blood sugar (postprandial glucose) level at 100 140 mg/dL.  HOME CARE INSTRUCTIONS   Have your hemoglobin A1c level checked twice a year.  Perform daily blood glucose monitoring as directed by your caregiver. It is common to perform frequent blood glucose monitoring.  Monitor urine ketones when you are ill and as directed by your caregiver.  Take your diabetes medicine and insulin as directed by your caregiver to   maintain your blood glucose level in the desired range.  Never run out of diabetes medicine or insulin. It is needed every day.  Adjust insulin based on your intake of carbohydrates. Carbohydrates can raise blood glucose levels but need to be included in your diet.  Carbohydrates provide vitamins, minerals, and fiber which are an essential part of a healthy diet. Carbohydrates are found in fruits, vegetables, whole grains, dairy products, legumes, and foods containing added sugars.    Eat healthy foods. Alternate 3 meals with 3 snacks.  Maintain a healthy weight gain. The usual total expected weight gain varies according to your prepregnancy body mass index (BMI).  Carry a medical alert card or wear your medical alert jewelry.  Carry a 15 gram carbohydrate snack with you at all times to treat low blood glucose (hypoglycemia). Some examples of 15 gram carbohydrate snacks include:  Glucose tablets, 3 or 4   Glucose gel, 15 gram tube  Raisins, 2 tablespoons (24 g)  Jelly beans, 6  Animal crackers, 8  Fruit juice, regular soda, or low fat milk, 4 ounces (120 mL)  Gummy treats, 9    Recognize hypoglycemia. Hypoglycemia during pregnancy occurs with blood glucose levels of 60 mg/dL and below. The risk for hypoglycemia increases when fasting or skipping meals, during or after intense exercise, and during sleep. Hypoglycemia symptoms can include:  Tremors or shakes.  Decreased ability to concentrate.  Sweating.  Increased heart rate.  Headache.  Dry mouth.  Hunger.  Irritability.  Anxiety.  Restless sleep.  Altered speech or coordination.  Confusion.  Treat hypoglycemia promptly. If you are alert and able to safely swallow, follow the 15:15 rule:  Take 15 20 grams of rapid-acting glucose or carbohydrate. Rapid-acting options include glucose gel, glucose tablets, or 4 ounces (120 mL) of fruit juice, regular soda, or low fat milk.  Check your blood glucose level 15 minutes after taking the glucose.   Take 15 20 grams more of glucose if the repeat blood glucose level is still 70 mg/dL or below.  Eat a meal or snack within 1 hour once blood glucose levels return to normal.  Be alert to polyuria and polydipsia which are early  signs of hyperglycemia. An early awareness of hyperglycemia allows for prompt treatment. Treat hyperglycemia as directed by your caregiver.  Engage in at least 30 minutes of physical activity a day or as directed by your caregiver. Ten minutes of physical activity timed 30 minutes after each meal is encouraged to control postprandial blood glucose levels.  Adjust your insulin dosing and food intake as needed if you start a new exercise or sport.  Follow your sick day plan at any time you are unable to eat or drink as usual.  Avoid tobacco and alcohol use.  Follow up with your caregiver regularly.  Follow the advice of your caregiver regarding your prenatal and post-delivery (postpartum) appointments, meal planning, exercise, medicines, vitamins, blood tests, other medical tests, and physical activities.  Perform daily skin and foot care. Examine your skin and feet daily for cuts, bruises, redness, nail problems, bleeding, blisters, or sores.  Brush your teeth and gums at least twice a day and floss at least once a day. Follow up with your dentist regularly.  Schedule an eye exam during the first trimester of your pregnancy or as directed by your caregiver.  Share your diabetes management plan with your workplace or school.  Stay up-to-date with immunizations.  Learn to manage stress.  Obtain ongoing diabetes education   and support as needed. SEEK MEDICAL CARE IF:   You are unable to eat food or drink fluids for more than 6 hours.  You have nausea and vomiting for more than 6 hours.  You have a blood glucose level of 200 mg/dL and you have ketones in your urine.  There is a change in mental status.  You develop vision problems.  You have a persistent headache.  You have upper abdominal pain or discomfort.  You develop an additional serious illness.  You have diarrhea for more than 6 hours.  You have been sick or have had a fever for a couple of days and are not getting  better. SEEK IMMEDIATE MEDICAL CARE IF:   You have difficulty breathing.  You no longer feel the baby moving.  You are bleeding or have discharge from your vagina.  You start having premature contractions or labor. MAKE SURE YOU:  Understand these instructions.  Will watch your condition.  Will get help right away if you are not doing well or get worse. Document Released: 01/13/2001 Document Revised: 07/01/2012 Document Reviewed: 05/05/2012 [employer] Patient Information 2014 [employer].  Breastfeeding A change in hormones during your pregnancy causes growth of your breast tissue and an increase in number and size of milk ducts. The hormone prolactin allows proteins, sugars, and fats from your blood supply to make breast milk in your milk-producing glands. The hormone progesterone prevents breast milk from being released before the birth of your baby. After the birth of your baby, your progesterone level decreases allowing breast milk to be released. Thoughts of your baby, as well as his or her sucking or crying, can stimulate the release of milk from the milk-producing glands. Deciding to breastfeed (nurse) is one of the best choices you can make for you and your baby. The information that follows gives a brief review of the benefits, as well as other important skills to know about breastfeeding. BENEFITS OF BREASTFEEDING For your baby  The first milk (colostrum) helps your baby's digestive system function better.   There are antibodies in your milk that help your baby fight off infections.   Your baby has a lower incidence of asthma, allergies, and sudden infant death syndrome (SIDS).   The nutrients in breast milk are better for your baby than infant formulas.  Breast milk improves your baby's brain development.   Your baby will have less gas, colic, and constipation.  Your baby is less likely to develop other conditions, such as childhood obesity, asthma, or diabetes  mellitus. For you  Breastfeeding helps develop a very special bond between you and your baby.   Breastfeeding is convenient, always available at the correct temperature, and costs nothing.   Breastfeeding helps to burn calories and helps you lose the weight gained during pregnancy.   Breastfeeding makes your uterus contract back down to normal size faster and slows bleeding following delivery.   Breastfeeding mothers have a lower risk of developing osteoporosis or breast or ovarian cancer later in life.  BREASTFEEDING FREQUENCY  A healthy, full-term baby may breastfeed as often as every hour or space his or her feedings to every 3 hours. Breastfeeding frequency will vary from baby to baby.   Newborns should be fed no less than every 2 3 hours during the day and every 4 5 hours during the night. You should breastfeed a minimum of 8 feedings in a 24 hour period.  Awaken your baby to breastfeed if it has been 3 4   hours since the last feeding.  Breastfeed when you feel the need to reduce the fullness of your breasts or when your newborn shows signs of hunger. Signs that your baby may be hungry include:  Increased alertness or activity.  Stretching.  Movement of the head from side to side.  Movement of the head and opening of the mouth when the corner of the mouth or cheek is stroked (rooting).  Increased sucking sounds, smacking lips, cooing, sighing, or squeaking.  Hand-to-mouth movements.  Increased sucking of fingers or hands.  Fussing.  Intermittent crying.  Signs of extreme hunger will require calming and consoling before you try to feed your baby. Signs of extreme hunger may include:  Restlessness.  A loud, strong cry.  Screaming.  Frequent feeding will help you make more milk and will help prevent problems, such as sore nipples and engorgement of the breasts.  BREASTFEEDING   Whether lying down or sitting, be sure that the baby's abdomen is facing your  abdomen.   Support your breast with 4 fingers under your breast and your thumb above your nipple. Make sure your fingers are well away from your nipple and your baby's mouth.   Stroke your baby's lips gently with your finger or nipple.   When your baby's mouth is open wide enough, place all of your nipple and as much of the colored area around your nipple (areola) as possible into your baby's mouth.  More areola should be visible above his or her upper lip than below his or her lower lip.  Your baby's tongue should be between his or her lower gum and your breast.  Ensure that your baby's mouth is correctly positioned around the nipple (latched). Your baby's lips should create a seal on your breast.  Signs that your baby has effectively latched onto your nipple include:  Tugging or sucking without pain.  Swallowing heard between sucks.  Absent click or smacking sound.  Muscle movement above and in front of his or her ears with sucking.  Your baby must suck about 2 3 minutes in order to get your milk. Allow your baby to feed on each breast as long as he or she wants. Nurse your baby until he or she unlatches or falls asleep at the first breast, then offer the second breast.  Signs that your baby is full and satisfied include:  A gradual decrease in the number of sucks or complete cessation of sucking.  Falling asleep.  Extension or relaxation of his or her body.  Retention of a small amount of milk in his or her mouth.  Letting go of your breast by himself or herself.  Signs of effective breastfeeding in you include:  Breasts that have increased firmness, weight, and size prior to feeding.  Breasts that are softer after nursing.  Increased milk volume, as well as a change in milk consistency and color by the 5th day of breastfeeding.  Breast fullness relieved by breastfeeding.  Nipples are not sore, cracked, or bleeding.  If needed, break the suction by putting your  finger into the corner of your baby's mouth and sliding your finger between his or her gums. Then, remove your breast from his or her mouth.  It is common for babies to spit up a small amount after a feeding.  Babies often swallow air during feeding. This can make babies fussy. Burping your baby between breasts can help with this.  Vitamin D supplements are recommended for babies who get only   breast milk.  Avoid using a pacifier during your baby's first 4 6 weeks.  Avoid supplemental feedings of water, formula, or juice in place of breastfeeding. Breast milk is all the food your baby needs. It is not necessary for your baby to have water or formula. Your breasts will make more milk if supplemental feedings are avoided during the early weeks. HOW TO TELL WHETHER YOUR BABY IS GETTING ENOUGH BREAST MILK Wondering whether or not your baby is getting enough milk is a common concern among mothers. You can be assured that your baby is getting enough milk if:   Your baby is actively sucking and you hear swallowing.   Your baby seems relaxed and satisfied after a feeding.   Your baby nurses at least 8 12 times in a 24 hour time period.  During the first 3 5 days of age:  Your baby is wetting at least 3 5 diapers in a 24 hour period. The urine should be clear and pale yellow.  Your baby is having at least 3 4 stools in a 24 hour period. The stool should be soft and yellow.  At 5 7 days of age, your baby is having at least 3 6 stools in a 24 hour period. The stool should be seedy and yellow by 5 days of age.  Your baby has a weight loss less than 7 10% during the first 3 days of age.  Your baby does not lose weight after 3 7 days of age.  Your baby gains 4 7 ounces each week after he or she is 4 days of age.  Your baby gains weight by 5 days of age and is back to birth weight within 2 weeks. ENGORGEMENT In the first week after your baby is born, you may experience extremely full breasts  (engorgement). When engorged, your breasts may feel heavy, warm, or tender to the touch. Engorgement peaks within 24 48 hours after delivery of your baby.  Engorgement may be reduced by:  Continuing to breastfeed.  Increasing the frequency of breastfeeding.  Taking warm showers or applying warm, moist heat to your breasts just before each feeding. This increases circulation and helps the milk flow.   Gently massaging your breast before and during the feedings. With your fingertips, massage from your chest wall towards your nipple in a circular motion.   Ensuring that your baby empties at least one breast at every feeding. It also helps to start the next feeding on the opposite breast.   Expressing breast milk by hand or by using a breast pump to empty the breasts if your baby is sleepy, or not nursing well. You may also want to express milk if you are returning to work oryou feel you are getting engorged.  Ensuring your baby is latched on and positioned properly while breastfeeding. If you follow these suggestions, your engorgement should improve in 24 48 hours. If you are still experiencing difficulty, call your lactation consultant or caregiver.  CARING FOR YOURSELF Take care of your breasts.  Bathe or shower daily.   Avoid using soap on your nipples.   Wear a supportive bra. Avoid wearing underwire style bras.  Air dry your nipples for a 3 4minutes after each feeding.   Use only cotton bra pads to absorb breast milk leakage. Leaking of breast milk between feedings is normal.   Use only pure lanolin on your nipples after nursing. You do not need to wash it off before feeding your baby again.   Another option is to express a few drops of breast milk and gently massage that milk into your nipples.  Continue breast self-awareness checks. Take care of yourself.  Eat healthy foods. Alternate 3 meals with 3 snacks.  Avoid foods that you notice affect your baby in a bad  way.  Drink milk, fruit juice, and water to satisfy your thirst (about 8 glasses a day).   Rest often, relax, and take your prenatal vitamins to prevent fatigue, stress, and anemia.  Avoid chewing and smoking tobacco.  Avoid alcohol and drug use.  Take over-the-counter and prescribed medicine only as directed by your caregiver or pharmacist. You should always check with your caregiver or pharmacist before taking any new medicine, vitamin, or herbal supplement.  Know that pregnancy is possible while breastfeeding. If desired, talk to your caregiver about family planning and safe birth control methods that may be used while breastfeeding. SEEK MEDICAL CARE IF:   You feel like you want to stop breastfeeding or have become frustrated with breastfeeding.  You have painful breasts or nipples.  Your nipples are cracked or bleeding.  Your breasts are red, tender, or warm.  You have a swollen area on either breast.  You have a fever or chills.  You have nausea or vomiting.  You have drainage from your nipples.  Your breasts do not become full before feedings by the 5th day after delivery.  You feel sad and depressed.  Your baby is too sleepy to eat well.  Your baby is having trouble sleeping.   Your baby is wetting less than 3 diapers in a 24 hour period.  Your baby has less than 3 stools in a 24 hour period.  Your baby's skin or the white part of his or her eyes becomes more yellow.   Your baby is not gaining weight by 5 days of age. MAKE SURE YOU:   Understand these instructions.  Will watch your condition.  Will get help right away if you are not doing well or get worse. Document Released: 10/07/2005 Document Revised: 07/01/2012 Document Reviewed: 05/13/2012 [employer] Patient Information 2014 [employer].  

## 2013-06-04 ENCOUNTER — Ambulatory Visit (INDEPENDENT_AMBULATORY_CARE_PROVIDER_SITE_OTHER): Payer: Medicaid Other | Admitting: Family

## 2013-06-04 VITALS — BP 142/84 | Wt 246.0 lb

## 2013-06-04 DIAGNOSIS — E119 Type 2 diabetes mellitus without complications: Secondary | ICD-10-CM

## 2013-06-04 DIAGNOSIS — Z3480 Encounter for supervision of other normal pregnancy, unspecified trimester: Secondary | ICD-10-CM

## 2013-06-04 DIAGNOSIS — O24919 Unspecified diabetes mellitus in pregnancy, unspecified trimester: Secondary | ICD-10-CM

## 2013-06-04 DIAGNOSIS — O24913 Unspecified diabetes mellitus in pregnancy, third trimester: Secondary | ICD-10-CM

## 2013-06-04 NOTE — Progress Notes (Signed)
[id number] 

## 2013-06-04 NOTE — Progress Notes (Signed)
Still declines IOL; NST reactive.  Plans to return on Monday for NST and appt.

## 2013-06-07 ENCOUNTER — Encounter: Payer: Self-pay | Admitting: Advanced Practice Midwife

## 2013-06-08 ENCOUNTER — Ambulatory Visit (INDEPENDENT_AMBULATORY_CARE_PROVIDER_SITE_OTHER): Payer: Self-pay | Admitting: *Deleted

## 2013-06-08 VITALS — BP 140/84 | Wt 248.0 lb

## 2013-06-08 DIAGNOSIS — O99810 Abnormal glucose complicating pregnancy: Secondary | ICD-10-CM

## 2013-06-08 DIAGNOSIS — O24419 Gestational diabetes mellitus in pregnancy, unspecified control: Secondary | ICD-10-CM

## 2013-06-08 NOTE — Progress Notes (Signed)
NST reactive.

## 2013-06-10 ENCOUNTER — Encounter (HOSPITAL_COMMUNITY): Payer: Self-pay | Admitting: *Deleted

## 2013-06-10 ENCOUNTER — Inpatient Hospital Stay (HOSPITAL_COMMUNITY)
Admission: AD | Admit: 2013-06-10 | Discharge: 2013-06-11 | DRG: 775 | Disposition: A | Discharge status: Home / Self Care | Payer: Medicaid Other | Source: Ambulatory Visit | Attending: Obstetrics & Gynecology | Admitting: Obstetrics & Gynecology

## 2013-06-10 DIAGNOSIS — O99814 Abnormal glucose complicating childbirth: Principal | ICD-10-CM | POA: Diagnosis present

## 2013-06-10 DIAGNOSIS — O09529 Supervision of elderly multigravida, unspecified trimester: Secondary | ICD-10-CM

## 2013-06-10 DIAGNOSIS — Z794 Long term (current) use of insulin: Secondary | ICD-10-CM

## 2013-06-10 LAB — OB RESULTS CONSOLE GC/CHLAMYDIA: Chlamydia: NEGATIVE

## 2013-06-10 LAB — CBC
MCH: 29.4 pg (ref 26.0–34.0)
Platelets: 183 10*3/uL (ref 150–400)
RBC: 4.45 MIL/uL (ref 3.87–5.11)
WBC: 11.3 10*3/uL — ABNORMAL HIGH (ref 4.0–10.5)

## 2013-06-10 LAB — COMPREHENSIVE METABOLIC PANEL
ALT: 19 U/L (ref 0–35)
AST: 17 U/L (ref 0–37)
Alkaline Phosphatase: 168 U/L — ABNORMAL HIGH (ref 39–117)
CO2: 18 mEq/L — ABNORMAL LOW (ref 19–32)
Calcium: 9.7 mg/dL (ref 8.4–10.5)
Chloride: 101 mEq/L (ref 96–112)
GFR calc non Af Amer: >90 mL/min (ref >90)
Potassium: 4.3 mEq/L (ref 3.5–5.1)
Sodium: 134 mEq/L — ABNORMAL LOW (ref 135–145)
Total Bilirubin: 0.3 mg/dL (ref 0.3–1.2)

## 2013-06-10 LAB — RPR: RPR Ser Ql: NONREACTIVE

## 2013-06-10 LAB — PROTEIN / CREATININE RATIO, URINE: Protein Creatinine Ratio: 6.77 — ABNORMAL HIGH (ref 0.00–0.15)

## 2013-06-10 MED ORDER — BENZOCAINE-MENTHOL 20-0.5 % EX AERO
1.0000 "application " | INHALATION_SPRAY | CUTANEOUS | Status: DC | PRN
  Filled 2013-06-10: qty 56

## 2013-06-10 MED ORDER — OXYTOCIN 40 UNITS IN LACTATED RINGERS INFUSION - SIMPLE MED
INTRAVENOUS | Status: AC
  Filled 2013-06-10: qty 1000

## 2013-06-10 MED ORDER — ONDANSETRON HCL 4 MG PO TABS: 4.0000 mg | ORAL_TABLET | ORAL | Status: DC | PRN

## 2013-06-10 MED ORDER — ZOLPIDEM TARTRATE 5 MG PO TABS: 5.0000 mg | ORAL_TABLET | Freq: Every evening | ORAL | Status: DC | PRN

## 2013-06-10 MED ORDER — OXYTOCIN 40 UNITS IN LACTATED RINGERS INFUSION - SIMPLE MED: 62.5000 mL/h | INTRAVENOUS | Status: DC

## 2013-06-10 MED ORDER — OXYCODONE-ACETAMINOPHEN 5-325 MG PO TABS
1.0000 | ORAL_TABLET | ORAL | Status: DC | PRN
  Administered 2013-06-10: 2 via ORAL
  Administered 2013-06-10 – 2013-06-11 (×3): 1 via ORAL
  Filled 2013-06-10: qty 1
  Filled 2013-06-10: qty 2
  Filled 2013-06-10 (×2): qty 1

## 2013-06-10 MED ORDER — PRENATAL MULTIVITAMIN CH
1.0000 | ORAL_TABLET | Freq: Every day | ORAL | Status: DC
  Administered 2013-06-11: 1 via ORAL
  Filled 2013-06-10: qty 1

## 2013-06-10 MED ORDER — CITRIC ACID-SODIUM CITRATE 334-500 MG/5ML PO SOLN: 30.0000 mL | ORAL | Status: DC | PRN

## 2013-06-10 MED ORDER — OXYTOCIN 10 UNIT/ML IJ SOLN
20.0000 [IU] | Freq: Once | INTRAMUSCULAR | Status: AC
  Administered 2013-06-10: 20 [IU] via INTRAMUSCULAR

## 2013-06-10 MED ORDER — OXYTOCIN BOLUS FROM INFUSION: 500.0000 mL | INTRAVENOUS | Status: DC

## 2013-06-10 MED ORDER — IBUPROFEN 600 MG PO TABS
600.0000 mg | ORAL_TABLET | Freq: Four times a day (QID) | ORAL | Status: DC | PRN
  Administered 2013-06-10: 600 mg via ORAL
  Filled 2013-06-10: qty 1

## 2013-06-10 MED ORDER — SIMETHICONE 80 MG PO CHEW: 80.0000 mg | CHEWABLE_TABLET | ORAL | Status: DC | PRN

## 2013-06-10 MED ORDER — DIPHENHYDRAMINE HCL 25 MG PO CAPS: 25.0000 mg | ORAL_CAPSULE | Freq: Four times a day (QID) | ORAL | Status: DC | PRN

## 2013-06-10 MED ORDER — ONDANSETRON HCL 4 MG/2ML IJ SOLN: 4.0000 mg | Freq: Four times a day (QID) | INTRAMUSCULAR | Status: DC | PRN

## 2013-06-10 MED ORDER — LIDOCAINE HCL (PF) 1 % IJ SOLN
30.0000 mL | INTRAMUSCULAR | Status: DC | PRN
  Filled 2013-06-10: qty 30

## 2013-06-10 MED ORDER — OXYCODONE-ACETAMINOPHEN 5-325 MG PO TABS
1.0000 | ORAL_TABLET | ORAL | Status: DC | PRN
  Administered 2013-06-10: 1 via ORAL
  Administered 2013-06-10: 2 via ORAL
  Administered 2013-06-10: 1 via ORAL
  Filled 2013-06-10 (×2): qty 1
  Filled 2013-06-10: qty 2

## 2013-06-10 MED ORDER — ACETAMINOPHEN 325 MG PO TABS: 650.0000 mg | ORAL_TABLET | ORAL | Status: DC | PRN

## 2013-06-10 MED ORDER — MISOPROSTOL 200 MCG PO TABS
800.0000 ug | ORAL_TABLET | Freq: Once | ORAL | Status: AC
  Administered 2013-06-10: 800 ug via RECTAL

## 2013-06-10 MED ORDER — LACTATED RINGERS IV SOLN: INTRAVENOUS | Status: DC

## 2013-06-10 MED ORDER — SENNOSIDES-DOCUSATE SODIUM 8.6-50 MG PO TABS
2.0000 | ORAL_TABLET | Freq: Every day | ORAL | Status: DC
  Administered 2013-06-10: 2 via ORAL

## 2013-06-10 MED ORDER — FLEET ENEMA 7-19 GM/118ML RE ENEM: 1.0000 | ENEMA | RECTAL | Status: DC | PRN

## 2013-06-10 MED ORDER — LANOLIN HYDROUS EX OINT: TOPICAL_OINTMENT | CUTANEOUS | Status: DC | PRN

## 2013-06-10 MED ORDER — OXYTOCIN 10 UNIT/ML IJ SOLN
INTRAMUSCULAR | Status: AC
  Filled 2013-06-10: qty 2

## 2013-06-10 MED ORDER — MEASLES, MUMPS & RUBELLA VAC ~~LOC~~ INJ: 0.5000 mL | INJECTION | Freq: Once | SUBCUTANEOUS | Status: DC

## 2013-06-10 MED ORDER — ONDANSETRON HCL 4 MG/2ML IJ SOLN: 4.0000 mg | INTRAMUSCULAR | Status: DC | PRN

## 2013-06-10 MED ORDER — TETANUS-DIPHTH-ACELL PERTUSSIS 5-2.5-18.5 LF-MCG/0.5 IM SUSP: 0.5000 mL | Freq: Once | INTRAMUSCULAR | Status: DC

## 2013-06-10 MED ORDER — DIBUCAINE 1 % RE OINT: 1.0000 "application " | TOPICAL_OINTMENT | RECTAL | Status: DC | PRN

## 2013-06-10 MED ORDER — LACTATED RINGERS IV SOLN: 500.0000 mL | INTRAVENOUS | Status: DC | PRN

## 2013-06-10 MED ORDER — WITCH HAZEL-GLYCERIN EX PADS: 1.0000 "application " | MEDICATED_PAD | CUTANEOUS | Status: DC | PRN

## 2013-06-10 MED ORDER — IBUPROFEN 600 MG PO TABS
600.0000 mg | ORAL_TABLET | Freq: Four times a day (QID) | ORAL | Status: DC
  Administered 2013-06-10 – 2013-06-11 (×5): 600 mg via ORAL
  Filled 2013-06-10 (×4): qty 1

## 2013-06-10 MED ORDER — MISOPROSTOL 200 MCG PO TABS
ORAL_TABLET | ORAL | Status: AC
  Filled 2013-06-10: qty 4

## 2013-06-10 NOTE — MAU Note (Signed)
srom at 0800, clear fluid.  Was due yesterday, was 4cm when last checked 9th baby. Taken directly to l&d via wc.

## 2013-06-10 NOTE — Progress Notes (Signed)
UR completed 

## 2013-06-10 NOTE — Progress Notes (Signed)
Pt reports she is DM type2.  She is not sure whether she should continue to take her Metformin and Glyubride.  She took it this am and usually takes it BID.  I assured her we could get it for her if she felt like she needed it.  She stated she'd like to see what her fasting CBG is in the am, without taking it this evening.  Her 'sugars' have been low in the evenings, but her fastings have been  elevated.  I called the resident, so she is aware, she stated that the pt reported only took her meds during pregnancy.

## 2013-06-10 NOTE — Consult Note (Signed)
Neonatology Note:  Attendance at Code Apgar:  Our team responded to a Code Apgar call to room # 173 following precipitous SVD, due to infant floppy and slow to breathe. The requesting physician was Dr. Beck (OB teaching service). The mother is a [id number] A pos, GBS neg with GDM on insulin, metformin and glyburide (per OB H and P). ROM occurred 1/2 hours PTD and the fluid was clear. At delivery, there was no shoulder dystocia, but the baby was floppy and slow to breathe. The OB nursing staff in attendance gave vigorous stimulation and a Code Apgar was called. Our team arrived at 2 minutes of life, at which time the baby was crying, pinking up well, and had good tone. I observed him until 5 minutes of life and he had no resp distress and remained pink in room air. Ap (per OB nursing staff)/9. I spoke with the parents in the DR, then transferred the baby to the Pediatrician's care.  Jaime Rosell C. Quincy Prisco, MD  

## 2013-06-10 NOTE — H&P (Signed)
Attestation of Attending Supervision of Fellow: Evaluation and management procedures were performed by the Fellow under my supervision and collaboration.  I have reviewed the Fellow's note and chart, and I agree with the management and plan.    

## 2013-06-10 NOTE — H&P (Signed)
Jaime Morrow is a 37 y.o. female presenting for spontaneous onset of labor. History OB History   Grav Para Term Preterm Abortions TAB SAB Ect Mult Living   11 8 8  2  2   8      Past Medical History  Diagnosis Date  . Anemia   . Asthma   . Blood transfusion abn reaction or complication, no procedure mishap   . 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 Gestational diabetes     insulin and metformin  . Pregnancy induced hypertension     last pregnancy   Past Surgical History  Procedure Laterality Date  . Dilation and curettage of uterus  2005 x 2  . Lumbar laminectomy  2007   Family History: family history includes Alcohol abuse in her maternal grandfather and paternal grandfather; COPD in her maternal grandmother; Cancer in her maternal grandfather; Diabetes type II in her maternal grandmother; Rheum arthritis in her maternal grandmother. Social History:  reports that she has quit smoking. She has never used smokeless tobacco. She reports that she does not drink alcohol or use illicit drugs.   Prenatal Transfer Tool  Maternal Diabetes: Yes:  Diabetes Type:  Pre-pregnancy Genetic Screening: Normal Maternal Ultrasounds/Referrals: Normal Fetal Ultrasounds or other Referrals:  None, Fetal echo Maternal Substance Abuse:  No Significant Maternal Medications:  Meds include: Other: metformin & glyburide Significant Maternal Lab Results:  Lab values include: Group B Strep negative Other Comments:  None  ROS  Dilation: 9 Exam by:: Franziska Podgurski Last menstrual period 09/02/2012. Exam Physical Exam  Prenatal labs: ABO, Rh: A/POS/-- (02/04 1004) Antibody: NEG (02/04 1004) Rubella: 1.85 (02/04 1004) RPR: NON REAC (06/03 1408)  HBsAg: NEGATIVE (02/04 1004)  HIV: NON REACTIVE (06/03 1408)  GBS:   negative  Assessment/Plan: 37 yo [id number] at [redacted]w[redacted]d advanced maternal age grand multipara, A2 diabetic presenting with spontaneous onset of labor. Currently having  regular, painful contractions and at 9cm cervical dilation. - admit to L&D, admitting attending physician: Harraway-Smith - routine orders - expect NSVD  [name], MD 06/10/2013, 9:44 AM   I have seen and examined this patient and agree with above documentation in the resident's note. GBS negative. FWB- cat I tracing.  A2DM, will attempt to check FSBG prior to delivery.  Anticipate SVD  Rulon Abide, M.D. [facility] Fellow 06/10/2013 3:31 PM

## 2013-06-10 NOTE — MAU Note (Signed)
Pt pushing uncontrollably, head noted. Encouraged to get into bed for del

## 2013-06-11 ENCOUNTER — Encounter: Payer: Self-pay | Admitting: Family

## 2013-06-11 LAB — CBC
HCT: 37.6 % (ref 36.0–46.0)
Hemoglobin: 12.5 g/dL (ref 12.0–15.0)
MCHC: 33.2 g/dL (ref 30.0–36.0)
MCV: 88.1 fL (ref 78.0–100.0)

## 2013-06-11 LAB — PROTEIN / CREATININE RATIO, URINE: Total Protein, Urine: 10.2 mg/dL

## 2013-06-11 MED ORDER — HYDROCHLOROTHIAZIDE 25 MG PO TABS
25.0000 mg | ORAL_TABLET | Freq: Every day | ORAL | Status: DC
  Filled 2013-06-11 (×2): qty 1

## 2013-06-11 MED ORDER — IBUPROFEN 200 MG PO TABS: 600.0000 mg | ORAL_TABLET | Freq: Four times a day (QID) | ORAL | Status: DC | PRN

## 2013-06-11 MED ORDER — HYDROCHLOROTHIAZIDE 25 MG PO TABS: 25.0000 mg | ORAL_TABLET | Freq: Every day | ORAL | Status: DC

## 2013-06-11 NOTE — Discharge Summary (Signed)
Obstetric Discharge Summary Reason for Admission: onset of labor and rupture of membranes Prenatal Procedures: none Intrapartum Procedures: spontaneous vaginal delivery Postpartum Procedures: none Complications-Operative and Postpartum: none Hemoglobin  Date Value Range Status  06/11/2013 12.5  12.0 - 15.0 g/dL Final     HCT  Date Value Range Status  06/11/2013 37.6  36.0 - 46.0 % Final    Physical Exam:  General: alert, cooperative, appears stated age and no distress Lochia: appropriate Uterine Fundus: firm Incision: none CTAB no wrc RRR no m/g/t  DVT Evaluation: No evidence of DVT seen on physical exam. Negative Homan's sign. No cords or calf tenderness. No significant calf/ankle edema.  Discharge Diagnoses: Term Pregnancy-delivered  Discharge Information: Date: 06/11/2013 Activity: pelvic rest and 6wks Diet: routine Medications: PNV and Ibuprofen Condition: stable Instructions: refer to practice specific booklet Discharge to: home Follow-up Information   Follow up with [facility]. (Please call and be seen at 1 week for blood pressure reevaluation and 1 month for post partum care)    Contact information:   [address] [place] [address]     declines contraception HTN: 1 week f/u for reevaluation and 4wk f/u in clinic for PP check  Newborn Data: Live born female  Birth Weight: 11 lb 3.5 oz (5089 g) APGAR: 8, 9  Home with mother.  Tawana Scale 06/11/2013, 9:57 AM  Added HCTZ 25mg  1 PO qd to discharge meds. BP at discharge 141/83. Keep follow up as scheduled.  Cam Hai 06/11/2013 6:30 PM

## 2013-06-11 NOTE — Significant Event (Signed)
Pt with sustained BP with last BP above SBP of 140, MD called and notified, order for HCTZ written by MD, BP rechecked and was slightly lower than parameters, pt chose to refuse dose prior to d/c.  Teaching of sx of high and low blood pressure completed, patient instructed to call MD, go to [facility] and check BP and pick up script  if sx noted.  Pt comfortable with plan of care, able to teach back sx of high and low BP, states no concerns and will follow up with her MD next Friday in clinic.

## 2013-06-14 NOTE — Discharge Summary (Signed)
Attestation of Attending Supervision of Advanced Practitioner (PA/CNM/NP): Evaluation and management procedures were performed by the Advanced Practitioner under my supervision and collaboration.  I have reviewed the Advanced Practitioner's note and chart, and I agree with the management and plan.  Max Romano, MD, FACOG Attending Obstetrician & Gynecologist Faculty Practice, [facility]  

## 2013-06-17 ENCOUNTER — Encounter: Payer: Self-pay | Admitting: Obstetrics & Gynecology

## 2013-06-17 ENCOUNTER — Ambulatory Visit (INDEPENDENT_AMBULATORY_CARE_PROVIDER_SITE_OTHER): Payer: Medicaid Other | Admitting: Obstetrics & Gynecology

## 2013-06-17 DIAGNOSIS — Z3480 Encounter for supervision of other normal pregnancy, unspecified trimester: Secondary | ICD-10-CM

## 2013-06-17 DIAGNOSIS — O169 Unspecified maternal hypertension, unspecified trimester: Secondary | ICD-10-CM

## 2013-06-17 NOTE — Progress Notes (Signed)
  Filed Vitals:   06/17/13 1429  BP: 147/88  Pulse: 83  Resp: 16   BP still elevated.  Pt is not taking medications.  Still c/o swelling. Pt now agrees to take HCTZ RTC for BP in a few days RTC in 5 weeks for post partum

## 2013-07-19 DIAGNOSIS — M519 Unspecified thoracic, thoracolumbar and lumbosacral intervertebral disc disorder: Secondary | ICD-10-CM | POA: Insufficient documentation

## 2013-07-29 ENCOUNTER — Encounter: Payer: Self-pay | Admitting: Obstetrics & Gynecology

## 2013-07-29 ENCOUNTER — Ambulatory Visit: Payer: Medicaid Other | Admitting: Obstetrics & Gynecology

## 2013-07-29 NOTE — Patient Instructions (Signed)

## 2013-07-29 NOTE — Progress Notes (Unsigned)
  Subjective:    Patient ID: Jaime Morrow, female    DOB: [birth year], 37 y.o.   MRN: [id number]  HPI  37 yo MW P9 is here for her PP visit. She reports that the baby is growing well. She has not had sex yet and they plan to use rhythm method. She has not checked her sugars in the last month and knows that she will need a 2 hour GTT soon. She had a laminectomy of L4 in the last month and is on percocet for that. She is having some constipation and now knows how to relieve that ( flax seed, Miralax, etc). She has some GSUI prior to delivery and is doing Kegels. I have told her that if this worsens over the next few months, that I will send her to a urologist. She denies pp depression. She is pumping some and denies any issues with her breasts.  Review of Systems Pap smear is due.    Objective:   Physical Exam  Involuted uterus Normal appearing vulva/vagina      Assessment & Plan:  Post partum- as above

## 2013-08-02 ENCOUNTER — Other Ambulatory Visit: Payer: Medicaid Other

## 2013-08-03 ENCOUNTER — Other Ambulatory Visit: Payer: Medicaid Other

## 2013-08-03 DIAGNOSIS — O24429 Gestational diabetes mellitus in childbirth, unspecified control: Secondary | ICD-10-CM

## 2013-08-03 LAB — GLUCOSE TOLERANCE, 2 HOURS
Glucose, 2 hour: 189 mg/dL — ABNORMAL HIGH (ref 70–139)
Glucose, Fasting: 123 mg/dL — ABNORMAL HIGH (ref 70–99)

## 2013-08-04 ENCOUNTER — Telehealth: Payer: Self-pay | Admitting: *Deleted

## 2013-08-04 NOTE — Telephone Encounter (Signed)
2HrGtt was elevated - adv pt to report to PCP for Tx of Diabetes. She will contact them

## 2013-11-22 IMAGING — US US OB DETAIL+14 WK
2 series · 16 of 28 positions shown · non-contrast
Comparison: none

[Series 1: us ob detail +14 wk · 8 acquisitions, 2 frames shown (1 of 2)]
[im 1/8]
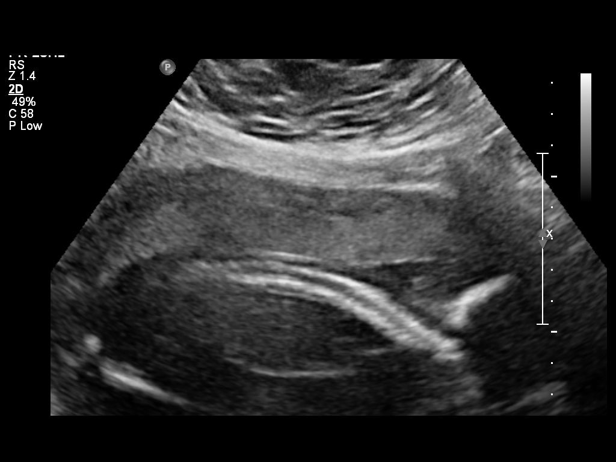
[im 8/8]
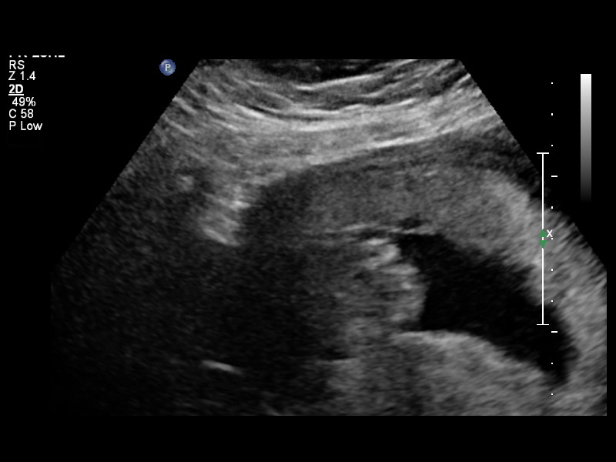

[Series 1: us ob detail +14 wk · 14 of 70 slices shown (2 of 2)]
[im 3/70]
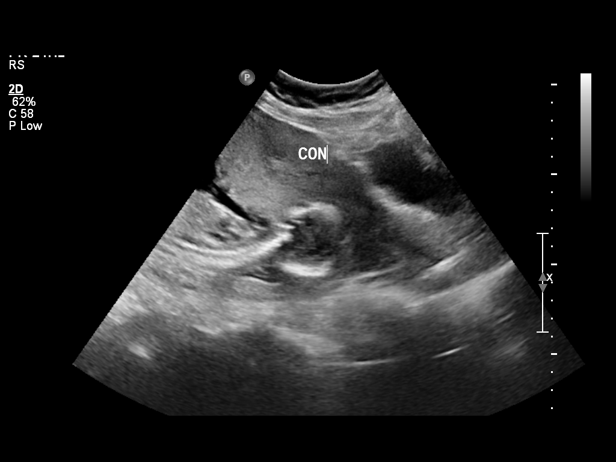
[im 9/70]
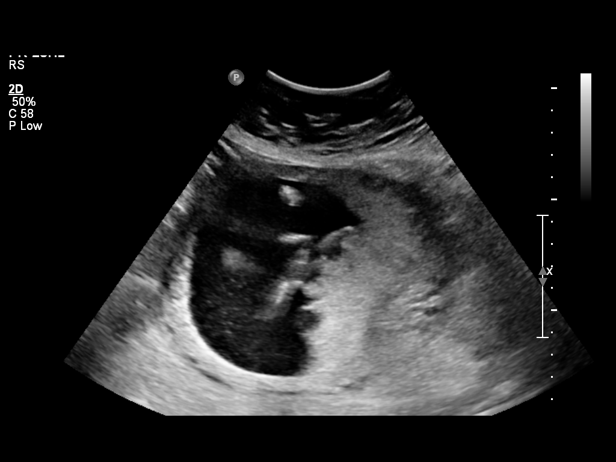
[im 12/70]
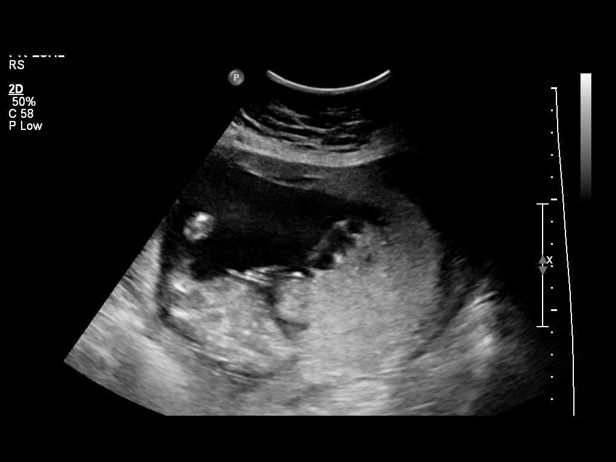
[im 18/70]
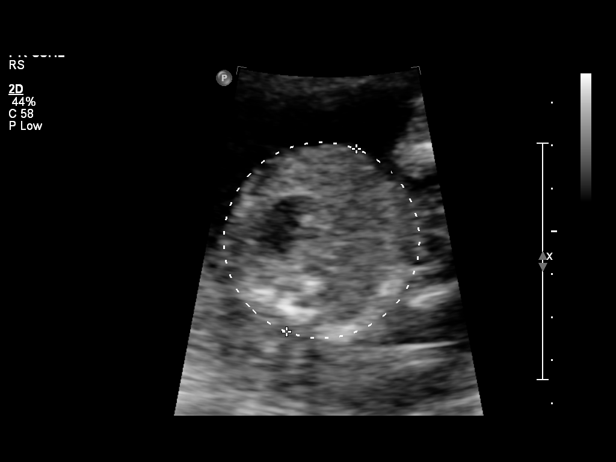
[im 24/70]
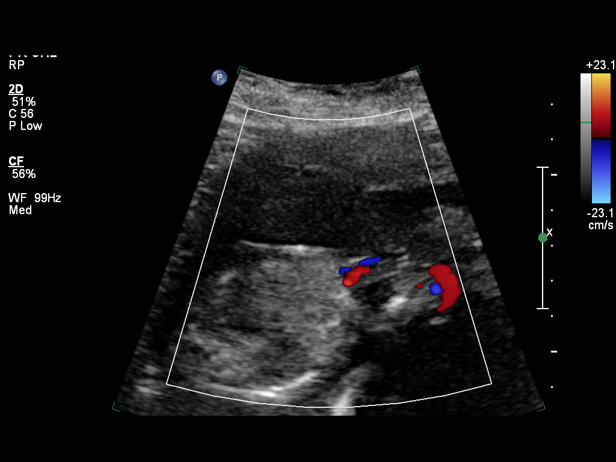
[im 29/70]
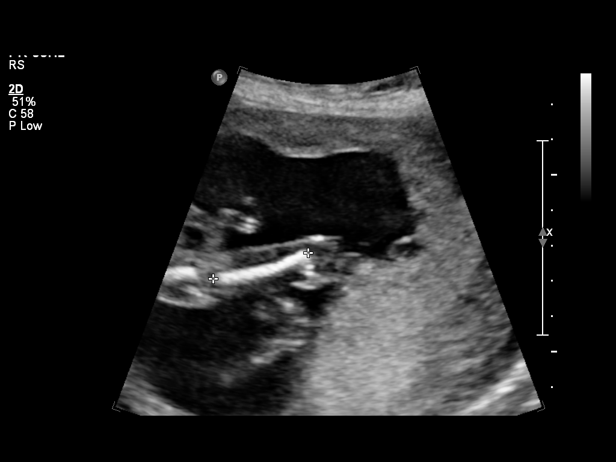
[im 32/70]
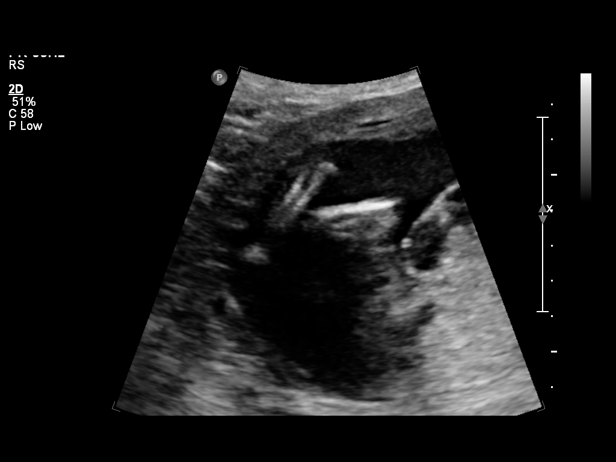
[im 38/70]
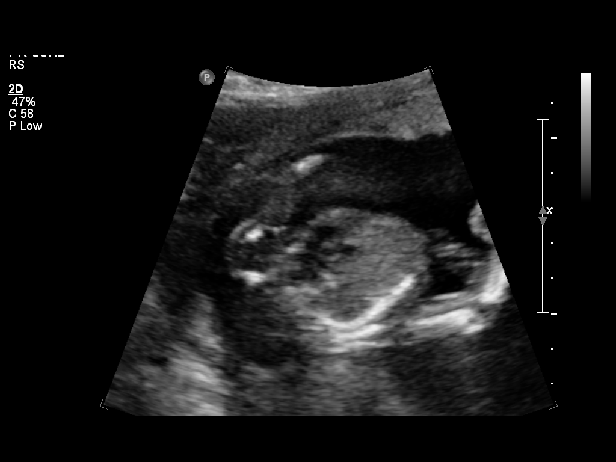
[im 44/70]
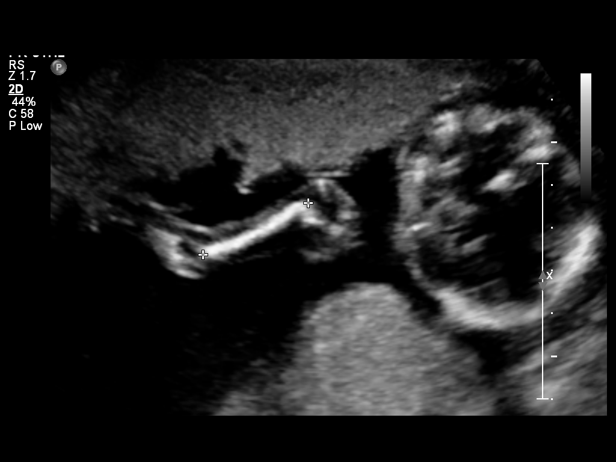
[im 49/70]
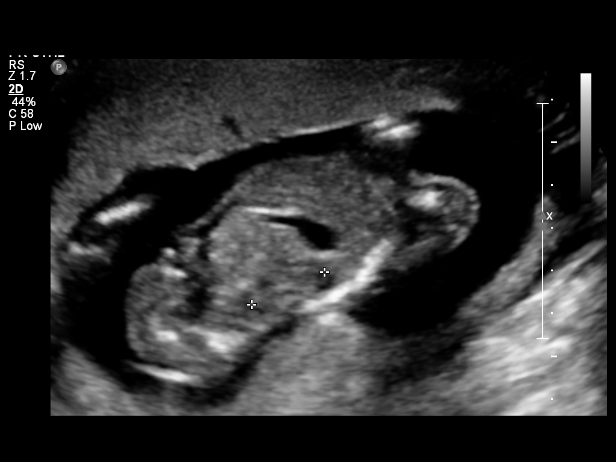
[im 52/70]
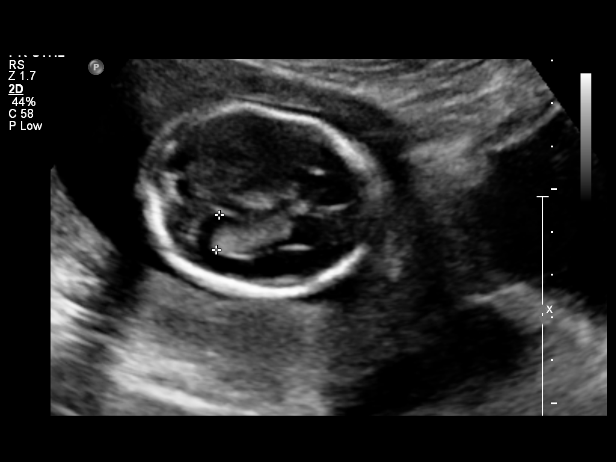
[im 58/70]
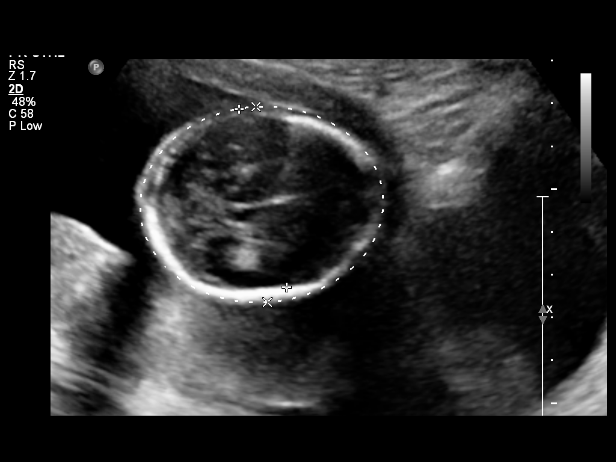
[im 64/70]
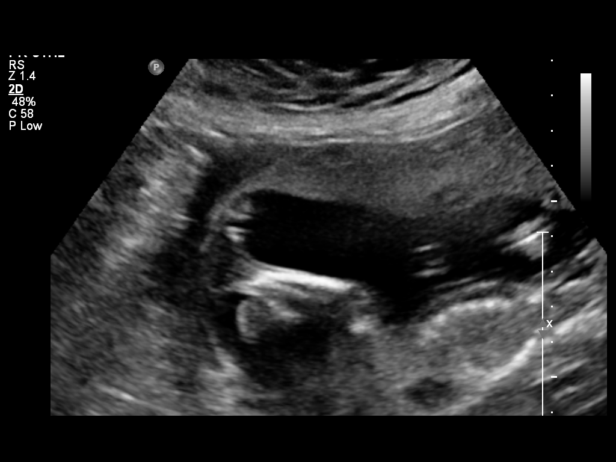
[im 70/70]
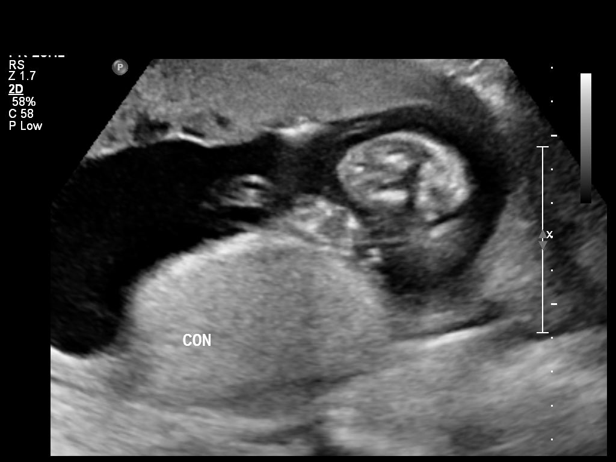

[16 of 28 positions shown; findings below may reference images not displayed]

OBSTETRICS REPORT
                      (Signed Final 01/22/2013 [DATE])

Service(s) Provided

 US OB DETAIL + 14 WK                                  76811.0
Indications

 Detailed fetal anatomic survey
 Diabetes - Gestational, A2 (medication controlled)

Fetal Evaluation

 Num Of Fetuses:    1
 Fetal Heart Rate:  143                         bpm
 Cardiac Activity:  Observed
 Presentation:      Cephalic
 Placenta:          Anterior, above cervical os
 P. Cord            Visualized, central
 Insertion:

 Amniotic Fluid
 AFI FV:      Subjectively within normal limits
                                             Larg Pckt:   4.24   cm
 RUQ:   4.24   cm
Biometry

 BPD:     43.1  mm    G. Age:   19w 0d                CI:        74.01   70 - 86
                                                      FL/HC:      17.7   16.8 -

 HC:     159.1  mm    G. Age:   18w 6d      < 3  %    HC/AC:      1.10   1.09 -

 AC:     144.6  mm    G. Age:   19w 5d       28  %    FL/BPD:
 FL:      28.1  mm    G. Age:   18w 4d        4  %    FL/AC:      19.4   20 - 24
 HUM:     27.5  mm    G. Age:   18w 6d       12  %

 Est. FW:     278  gm    0 lb 10 oz      25  %
Gestational Age

 LMP:           20w 2d       Date:   09/02/12                 EDD:   06/09/13
 U/S Today:     19w 0d                                        EDD:   06/18/13
 Best:          20w 2d    Det. By:   LMP  (09/02/12)          EDD:   06/09/13
2nd Trimester Genetic Sonogram - Trisomy 21 Screening
 Age:                                             36          Risk=1:   113

 Structural anomalies (inc. cardiac):             N/A
 Echogenic bowel:                                 No
 Hypoplastic / absent midphalanx 5th Digit:       No
 Wide space 8st-8nd toes:                         N/A
 Pyelectasis:                                     No
 2-vessel umbilical cord:                         No
 Echogenic cardiac foci:                          No

 Incomplete anatomic evaluation possible today
Anatomy

 Cranium:          Appears normal         Aortic Arch:      Appears normal
 Fetal Cavum:      Not well visualized    Ductal Arch:      Appears normal
 Ventricles:       Appears normal         Diaphragm:        Not well visualized
 Choroid Plexus:   Appears normal         Stomach:          Appears normal, left
                                                            sided
 Cerebellum:       Appears normal         Abdomen:          Not well visualized
 Posterior Fossa:  Appears normal         Abdominal Wall:   Not well visualized
 Nuchal Fold:      Appears normal         Cord Vessels:     Appears normal (3
                                                            vessel cord)
 Face:             Not well visualized    Kidneys:          Appear normal
 Lips:             Not well visualized    Bladder:          Appears normal
 Heart:            Appears normal         Spine:            Limited views
                   (4CH, axis, and                          appear normal
                   situs)
 RVOT:             Not well visualized    Lower             Appears normal
                                          Extremities:
 LVOT:             Appears normal         Upper             Appears normal
                                          Extremities:

 Other:  Heels visualized. Fetus appears to be a male.
Cervix Uterus Adnexa

 Cervical Length:   3.7       cm

 Cervix:       Normal appearance by transabdominal scan.
 Uterus:       No abnormality visualized.
 Left Ovary:   No adnexal mass visualized.
 Right Ovary:  No adnexal mass visualized.

 Adnexa:     No abnormality visualized.
Comments

 Technically limited exam due to maternal body habitus.
Impression

 Siup demonstrating an EGA by ultrasound of 19w 0d. This
 corresponds well with expected EGA by LMP of 20w 2d.
 Follow up to assess for appropriate linear growth would be
 recommended given today's discrepancy in 3 weeks

 Overall anatomic resolution was compromised by maternal
 body habitus. Visualized fetal anatomy appears normal with
 assessment of the fetal face, abdomen, spine and distal
 extremities limited today. Hopeful improvement in anatomic
 evaluation can be performed at the time of growth
 reassessmentNo soft markers for Down Syndrome are seen.
 Correlation with other aneuploidy screening results, if
 available, would be recommended for a more complete risk
 assessment.

 Subjectively and quantitatively normal amniotic fluid volume.
 Normal cervical length.
Recommendations

 Follow-up in 3 weeks is recommended to assess for
 appropriate linear growth and reevaluate fetal anatomy given
 today's findings.

## 2014-03-25 IMAGING — US US OB FOLLOW-UP
1 series · 12 of 28 positions shown · non-contrast
Comparison: none

[Series 1: us ob follow up · 12 of 45 slices shown]
[im 2/45]
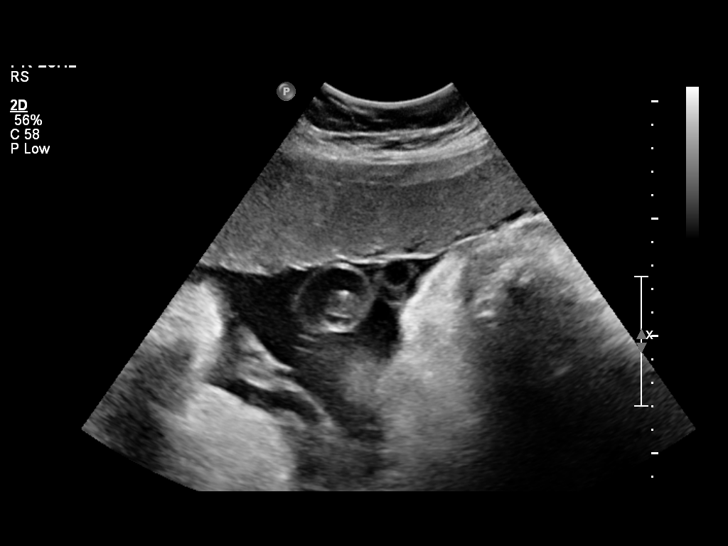
[im 5/45]
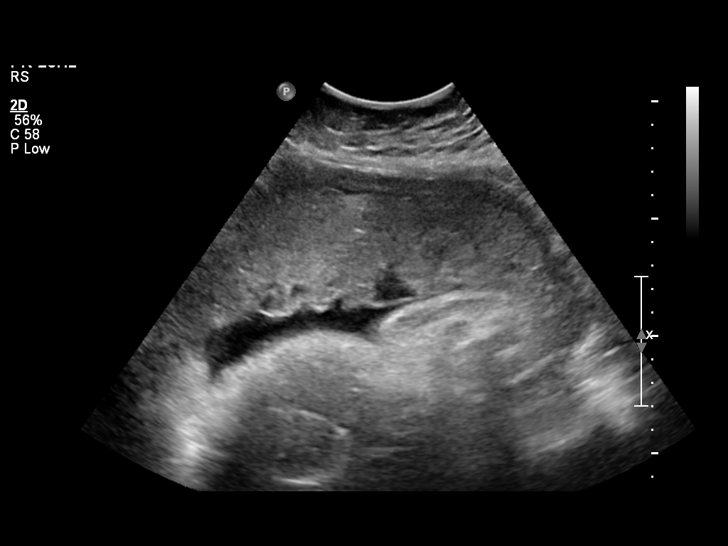
[im 9/45]
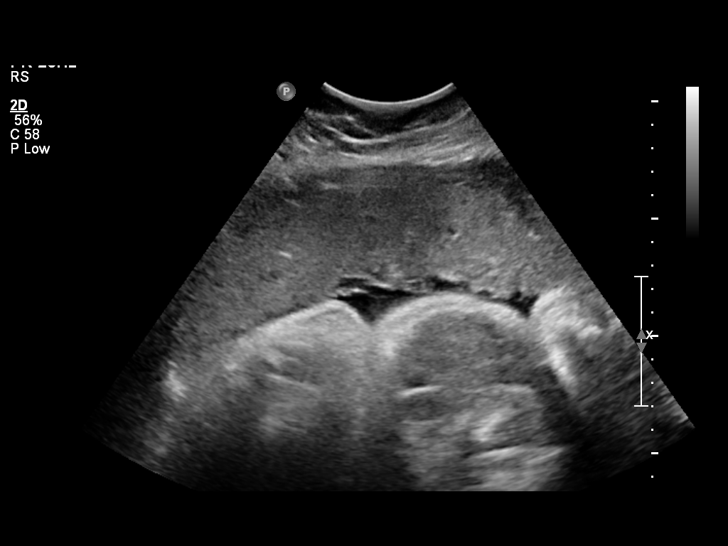
[im 14/45]
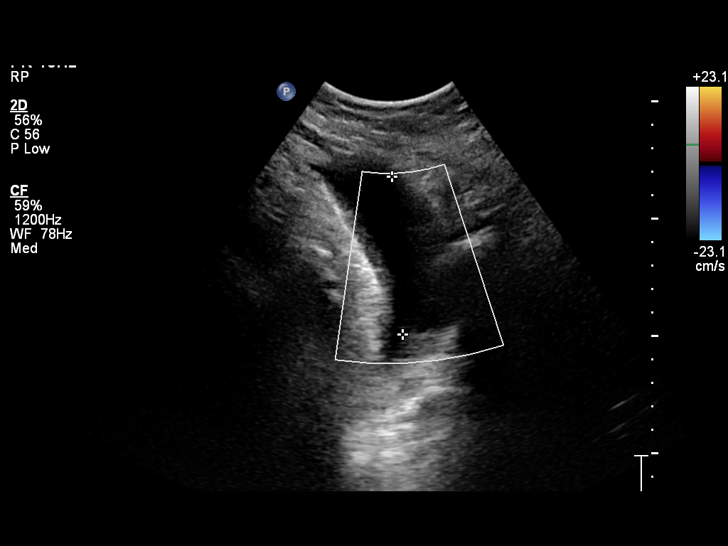
[im 17/45]
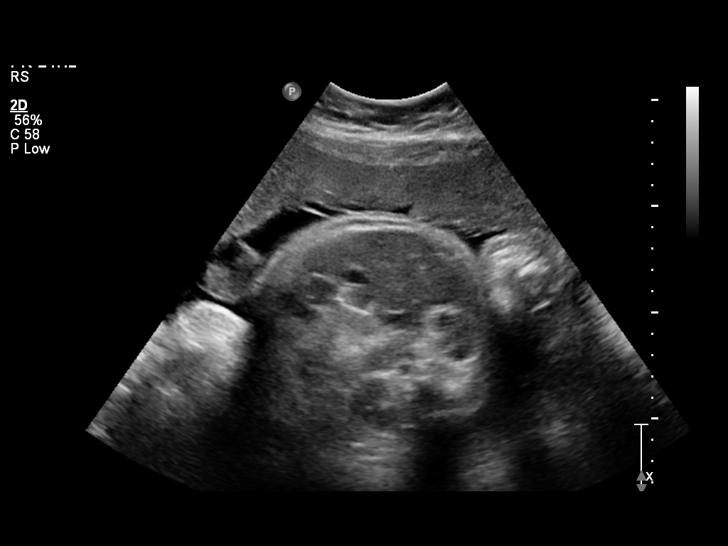
[im 20/45]
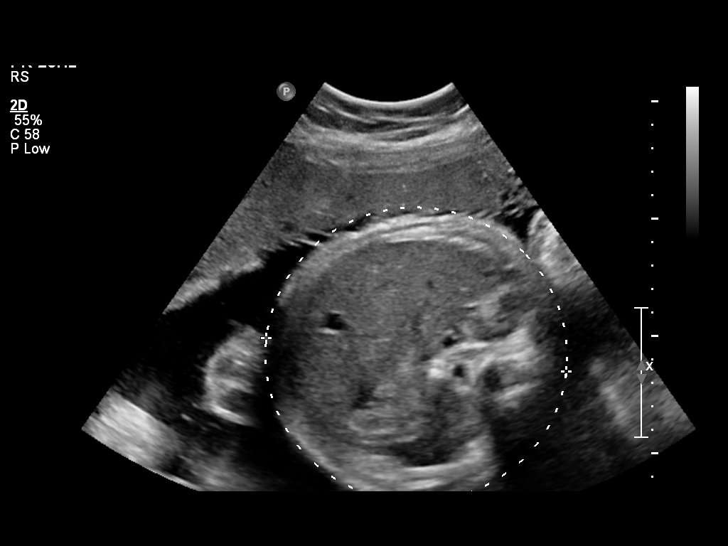
[im 25/45]
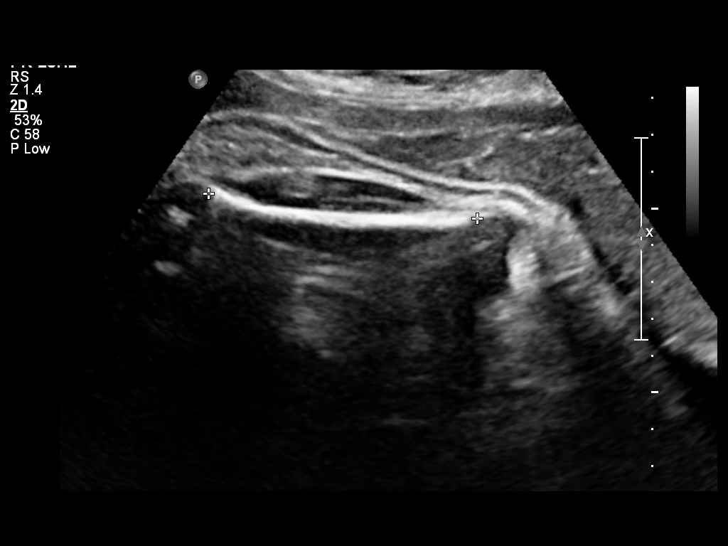
[im 28/45]
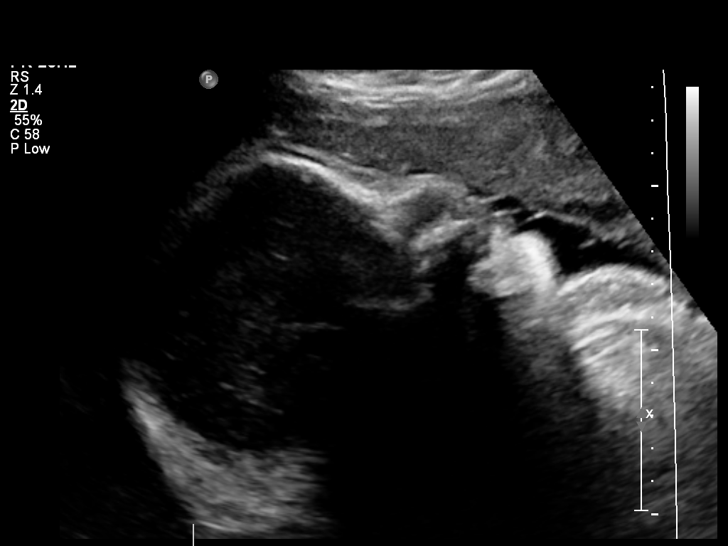
[im 31/45]
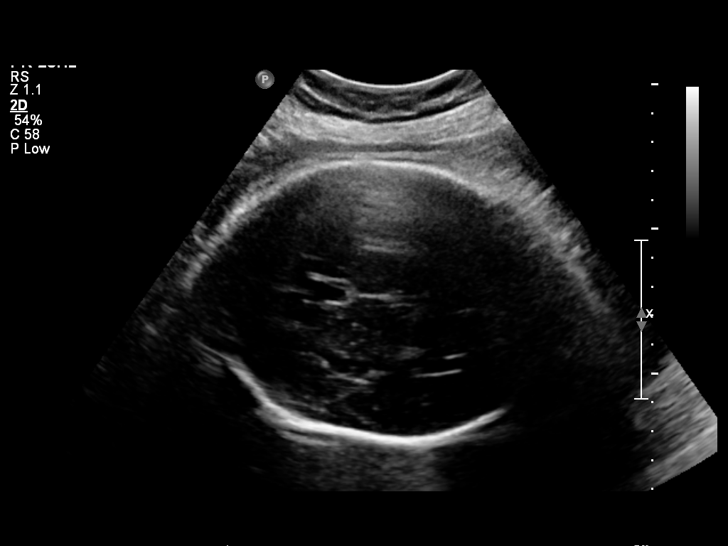
[im 36/45]
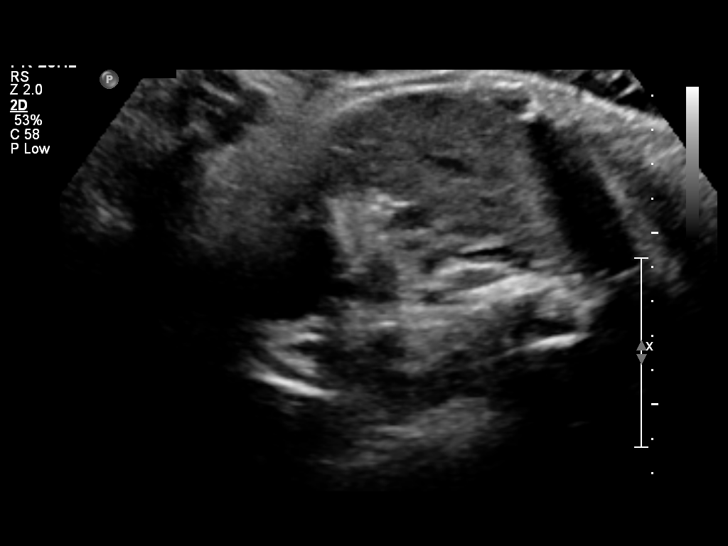
[im 40/45]
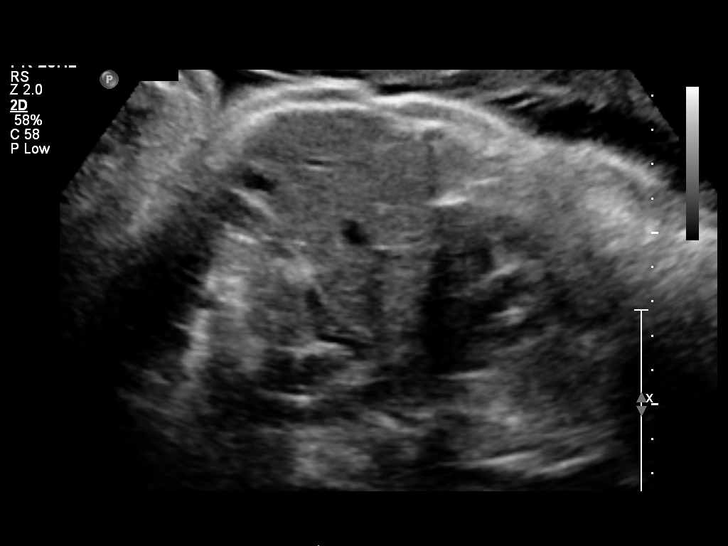
[im 43/45]
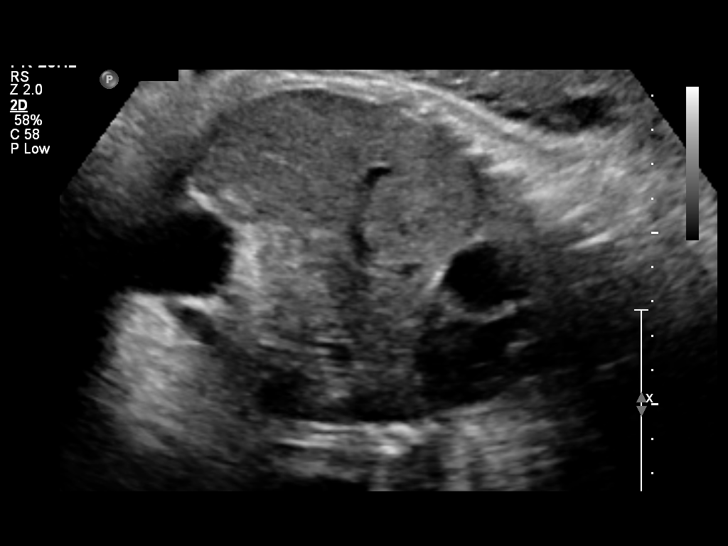

[12 of 28 positions shown; findings below may reference images not displayed]

OBSTETRICS REPORT
                      (Signed Final 05/25/2013 [DATE])

Service(s) Provided

 US OB FOLLOW UP                                       76816.1
Indications

 Diabetes - Gestational, A2 (medication controlled)
 Advanced maternal age (AMA), Multigravida
 Obesity complicating pregnancy                        649.13,
 Asthma
 Poor obstetric history: Previous preeclampsia /
 eclampsia/gestational HTN
Fetal Evaluation

 Num Of Fetuses:    1
 Fetal Heart Rate:  133                         bpm
 Cardiac Activity:  Observed
 Presentation:      Cephalic
 Placenta:          Anterior, above cervical os
 P. Cord            Visualized, central
 Insertion:

 Amniotic Fluid
 AFI FV:      Subjectively within normal limits
 AFI Sum:     22.88   cm      90   %Tile     Larg Pckt:   7.83   cm
 RUQ:   5.57   cm    RLQ:    6.72   cm    LUQ:   2.76    cm   LLQ:    7.83   cm
Biometry

 BPD:     94.5  mm    G. Age:   38w 4d                CI:        71.42   70 - 86
                                                      FL/HC:      20.2   20.9 -

 HC:     356.1  mm    G. Age:   41w 5d       97  %    HC/AC:      0.92   0.92 -

 AC:     388.7  mm    G. Age:   N/A        > 97  %    FL/BPD:     76.0   71 - 87
 FL:      71.8  mm    G. Age:   36w 6d       25  %    FL/AC:      18.5   20 - 24

 Est. FW:    4314  gm      9 lb 7 oz   > 90  %
Gestational Age

 LMP:           37w 6d       Date:   09/02/12                 EDD:   06/09/13
 U/S Today:     39w 0d                                        EDD:   06/01/13
 Best:          37w 6d    Det. By:   LMP  (09/02/12)          EDD:   06/09/13
Anatomy
 Cranium:          Appears normal         Aortic Arch:      Previously seen
 Fetal Cavum:      Appears normal         Ductal Arch:      Previously seen
 Ventricles:       Appears normal         Diaphragm:        Appears normal
 Choroid Plexus:   Previously seen        Stomach:          Appears normal, left
                                                            sided
 Cerebellum:       Previously seen        Abdomen:          Appears normal
 Posterior Fossa:  Previously seen        Abdominal Wall:   Not well visualized
 Nuchal Fold:      Previously seen        Cord Vessels:     Previously seen
 Face:             Orbits and profile     Kidneys:          Appear normal
                   previously seen
 Lips:             Previously seen        Bladder:          Appears normal
 Heart:            Previously seen        Spine:            Not well visualized
 RVOT:             Appears normal         Lower             Previously seen
                                          Extremities:
 LVOT:             Previously seen        Upper             Previously seen
                                          Extremities:

 Other:  Heels previously visualized. Fetus appears to be a male.  Technicallly
         difficult due to advanced GA and maternal habitus.
Targeted Anatomy

 Fetal Central Nervous System
 Lat. Ventricles:
Cervix Uterus Adnexa

 Cervix:       Not visualized (advanced GA >34 wks)
Impression

 Single live IUP in cephalic presentation.  >90%ile EFW.
 No late-developing anomaly in visualized structures above.

## 2014-06-15 NOTE — Progress Notes (Signed)
Last pap 11/13.  Pt of already getting girdle pain  IUP with FHT of 153 BPM  HC measures @ 14 weeks

## 2014-06-17 ENCOUNTER — Ambulatory Visit (INDEPENDENT_AMBULATORY_CARE_PROVIDER_SITE_OTHER): Payer: Medicaid Other | Admitting: Advanced Practice Midwife

## 2014-06-17 ENCOUNTER — Encounter: Payer: Self-pay | Admitting: Advanced Practice Midwife

## 2014-06-17 VITALS — BP 138/72 | HR 97 | Wt 224.0 lb

## 2014-06-17 DIAGNOSIS — O24319 Unspecified pre-existing diabetes mellitus in pregnancy, unspecified trimester: Secondary | ICD-10-CM | POA: Insufficient documentation

## 2014-06-17 DIAGNOSIS — Z3480 Encounter for supervision of other normal pregnancy, unspecified trimester: Secondary | ICD-10-CM

## 2014-06-17 DIAGNOSIS — O162 Unspecified maternal hypertension, second trimester: Secondary | ICD-10-CM

## 2014-06-17 DIAGNOSIS — IMO0002 Reserved for concepts with insufficient information to code with codable children: Secondary | ICD-10-CM

## 2014-06-17 DIAGNOSIS — M899 Disorder of bone, unspecified: Secondary | ICD-10-CM

## 2014-06-17 DIAGNOSIS — O99891 Other specified diseases and conditions complicating pregnancy: Secondary | ICD-10-CM

## 2014-06-17 DIAGNOSIS — Z36 Encounter for antenatal screening of mother: Secondary | ICD-10-CM

## 2014-06-17 DIAGNOSIS — O09529 Supervision of elderly multigravida, unspecified trimester: Secondary | ICD-10-CM

## 2014-06-17 DIAGNOSIS — O9989 Other specified diseases and conditions complicating pregnancy, childbirth and the puerperium: Secondary | ICD-10-CM

## 2014-06-17 DIAGNOSIS — M7918 Myalgia, other site: Secondary | ICD-10-CM

## 2014-06-17 DIAGNOSIS — M259 Joint disorder, unspecified: Secondary | ICD-10-CM

## 2014-06-17 DIAGNOSIS — E119 Type 2 diabetes mellitus without complications: Secondary | ICD-10-CM

## 2014-06-17 DIAGNOSIS — O24919 Unspecified diabetes mellitus in pregnancy, unspecified trimester: Secondary | ICD-10-CM

## 2014-06-17 DIAGNOSIS — O24312 Unspecified pre-existing diabetes mellitus in pregnancy, second trimester: Secondary | ICD-10-CM

## 2014-06-17 DIAGNOSIS — O169 Unspecified maternal hypertension, unspecified trimester: Secondary | ICD-10-CM

## 2014-06-17 NOTE — Progress Notes (Signed)
Subjective:    Jaime Morrow is a [id number] [redacted]w[redacted]d being seen today for her first obstetrical visit.  Her obstetrical history is significant for obesity and pregestational diabetes, NSVD x9. Patient does intend to breast feed. Pregnancy history fully reviewed.  Patient reports pelvic pain with movement.  Had pelvic girdle pain last pregnancy and then had ruptured disc and surgery 5 weeks postpartum.Ceasar Mons Vitals:   06/17/14 1040  BP: 138/72  Pulse: 97  Weight: 101.606 kg (224 lb)    HISTORY: OB History  Gravida Para Term Preterm AB SAB TAB Ectopic Multiple Living  # Outcome Date GA Lbr Len/2nd Weight Sex Delivery Anes PTL Lv  12 CUR           11 TRM 06/10/13 [redacted]w[redacted]d 01:25 5.089 kg (11 lb 3.5 oz) M SVD None  Y     Comments: faciel bruising  10 TRM 02/26/12 [redacted]w[redacted]d 06:15 / 00:15 3.34 kg (7 lb 5.8 oz) M VAC EPI  Y  9 TRM 10/16/10 [redacted]w[redacted]d  3.43 kg (7 lb 9 oz) F SVD EPI N Y  8 TRM 03/16/09 [redacted]w[redacted]d  3.657 kg (8 lb 1 oz) M SVD EPI N Y     Comments: Pre eclampsia  7 TRM 10/17/07 [redacted]w[redacted]d  3.742 kg (8 lb 4 oz) F SVD EPI N Y  6 TRM 06/16/05 [redacted]w[redacted]d  3.6 kg (7 lb 15 oz) F SVD EPI N Y     Comments: Gestational diabetes  5 SAB 05/16/04             Comments: D&C @ 10 wks/ Blood transfusion  4 SAB 02/15/04             Comments: D & C  @ 13 wks  3 TRM 11/16/02 [redacted]w[redacted]d   F SVD EPI N Y  2 TRM 04/16/00 [redacted]w[redacted]d  3.685 kg (8 lb 2 oz) F SVD EPI N Y  1 TRM 08/16/98 [redacted]w[redacted]d  3.685 kg (8 lb 2 oz) M SVD EPI N Y     Comments: Dry birth     Past Medical History  Diagnosis Date  . Anemia   . Asthma   . Blood transfusion abn reaction or complication, no procedure mishap   . 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 Gestational diabetes     insulin and metformin  . Preexisting diabetes complicating pregnancy, antepartum 11/30/2012  . Ruptured lumbar disc    Past Surgical History  Procedure Laterality Date  . Dilation and curettage of uterus  2005 x 2  . Lumbar  laminectomy  2007  . Lumbar disc surgery     Family History  Problem Relation Age of Onset  . COPD Maternal Grandmother   . Diabetes type II Maternal Grandmother   . Rheum arthritis Maternal Grandmother   . Cancer Maternal Grandfather     colon  . Alcohol abuse Maternal Grandfather   . Alcohol abuse Paternal Grandfather      Exam   Bedside sono with FHR 153 and CRL consistent with LMP dating   Uterus:     Pelvic Exam: Deferred--last Pap 2013   Respiratory:  appears well, vitals normal, no respiratory distress, acyanotic, normal RR, ear and throat exam is normal, neck free of mass or lymphadenopathy   Abdomen: soft, non-tender; bowel sounds normal; no masses,  no organomegaly   Urinary: not evaluated      Assessment:  Pregnancy: [id number] Patient Active Problem List   Diagnosis Date Noted  . Preexisting diabetes complicating pregnancy, antepartum 06/17/2014    Priority: High  . Grand multipara 02/17/2013  . Obesity complicating pregnancy 02/09/2013  . AMA (advanced maternal age) multigravida 35+ 02/09/2013  . Asthma 09/25/2011        Plan:     Initial labs and A1C drawn. Will collect 24 hour urine and drop off in office next week. Prenatal vitamins. Problem list reviewed and updated. Genetic Screening discussed : Would like [facility] for gender mostly, if covered by Medicaid.  Marland Kitchen  Ultrasound discussed; fetal survey: requested.  Follow up in 4 weeks. 50% of 30 min visit spent on counseling and coordination of care.     LEFTWICH-KIRBY, Luva Metzger 06/17/2014

## 2014-06-18 LAB — OBSTETRIC PANEL
ANTIBODY SCREEN: NEGATIVE
Basophils Absolute: 0 10*3/uL (ref 0.0–0.1)
Basophils Relative: 0 % (ref 0–1)
EOS ABS: 0.3 10*3/uL (ref 0.0–0.7)
EOS PCT: 3 % (ref 0–5)
HEMATOCRIT: 38.5 % (ref 36.0–46.0)
Hemoglobin: 13.2 g/dL (ref 12.0–15.0)
Hepatitis B Surface Ag: NEGATIVE
LYMPHS ABS: 1.3 10*3/uL (ref 0.7–4.0)
LYMPHS PCT: 14 % (ref 12–46)
MCH: 30.2 pg (ref 26.0–34.0)
MCHC: 34.3 g/dL (ref 30.0–36.0)
MCV: 88.1 fL (ref 78.0–100.0)
MONO ABS: 0.5 10*3/uL (ref 0.1–1.0)
MONOS PCT: 5 % (ref 3–12)
Neutro Abs: 7.4 10*3/uL (ref 1.7–7.7)
Neutrophils Relative %: 78 % — ABNORMAL HIGH (ref 43–77)
PLATELETS: 200 10*3/uL (ref 150–400)
RBC: 4.37 MIL/uL (ref 3.87–5.11)
RDW: 13.7 % (ref 11.5–15.5)
RH TYPE: POSITIVE
RUBELLA: 1.86 {index} — AB (ref <0.90)
WBC: 9.5 10*3/uL (ref 4.0–10.5)

## 2014-06-18 LAB — HEMOGLOBIN A1C
Hgb A1c MFr Bld: 6.2 % — ABNORMAL HIGH (ref <5.7)
Mean Plasma Glucose: 131 mg/dL — ABNORMAL HIGH (ref <117)

## 2014-06-18 LAB — HIV ANTIBODY (ROUTINE TESTING W REFLEX): HIV: NONREACTIVE

## 2014-06-18 LAB — GC/CHLAMYDIA PROBE AMP, URINE
Chlamydia, Swab/Urine, PCR: NEGATIVE
GC PROBE AMP, URINE: NEGATIVE

## 2014-06-19 LAB — CULTURE, URINE COMPREHENSIVE
COLONY COUNT: NO GROWTH
Organism ID, Bacteria: NO GROWTH

## 2014-06-24 LAB — CBC
HEMATOCRIT: 38.7 % (ref 36.0–46.0)
HEMOGLOBIN: 13.4 g/dL (ref 12.0–15.0)
MCH: 30.4 pg (ref 26.0–34.0)
MCHC: 34.6 g/dL (ref 30.0–36.0)
MCV: 87.8 fL (ref 78.0–100.0)
Platelets: 190 10*3/uL (ref 150–400)
RBC: 4.41 MIL/uL (ref 3.87–5.11)
RDW: 13.2 % (ref 11.5–15.5)
WBC: 9.4 10*3/uL (ref 4.0–10.5)

## 2014-06-24 LAB — COMPREHENSIVE METABOLIC PANEL
ALT: 11 U/L (ref 0–35)
AST: 9 U/L (ref 0–37)
Albumin: 3.6 g/dL (ref 3.5–5.2)
Alkaline Phosphatase: 53 U/L (ref 39–117)
BILIRUBIN TOTAL: 0.3 mg/dL (ref 0.2–1.2)
BUN: 9 mg/dL (ref 6–23)
CALCIUM: 8.9 mg/dL (ref 8.4–10.5)
CHLORIDE: 104 meq/L (ref 96–112)
CO2: 20 mEq/L (ref 19–32)
CREATININE: 0.47 mg/dL — AB (ref 0.50–1.10)
Glucose, Bld: 128 mg/dL — ABNORMAL HIGH (ref 70–99)
Potassium: 4.5 mEq/L (ref 3.5–5.3)
Sodium: 135 mEq/L (ref 135–145)
Total Protein: 6.5 g/dL (ref 6.0–8.3)

## 2014-06-25 LAB — CREATININE CLEARANCE, URINE, 24 HOUR
CREAT CLEAR: 272 mL/min — AB (ref 75–115)
CREATININE 24H UR: 1839 mg/d — AB (ref 700–1800)
CREATININE, URINE: 61.3 mg/dL
CREATININE: 0.47 mg/dL — AB (ref 0.50–1.10)

## 2014-06-25 LAB — PROTEIN, URINE, 24 HOUR
PROTEIN 24H UR: 180 mg/d — AB (ref <150)
PROTEIN, URINE: 6 mg/dL (ref 5–24)

## 2014-06-28 ENCOUNTER — Encounter: Payer: Self-pay | Admitting: Advanced Practice Midwife

## 2014-07-11 ENCOUNTER — Other Ambulatory Visit: Payer: Self-pay | Admitting: *Deleted

## 2014-07-11 DIAGNOSIS — E119 Type 2 diabetes mellitus without complications: Secondary | ICD-10-CM

## 2014-07-11 MED ORDER — METFORMIN HCL 500 MG PO TABS: ORAL_TABLET | ORAL | Status: DC

## 2014-07-11 NOTE — Telephone Encounter (Signed)
RF authorization for Metformin sent to [facility] pharmacy

## 2014-07-20 ENCOUNTER — Telehealth: Payer: Self-pay | Admitting: *Deleted

## 2014-07-20 DIAGNOSIS — O09522 Supervision of elderly multigravida, second trimester: Secondary | ICD-10-CM

## 2014-07-20 NOTE — Telephone Encounter (Signed)
Pt called for ROB appt and appt for routine prenatal anatomy scan.  Appt is 07/25/14 @ 2:00 PM.

## 2014-07-25 ENCOUNTER — Ambulatory Visit (HOSPITAL_COMMUNITY)
Admission: RE | Admit: 2014-07-25 | Discharge: 2014-07-25 | Disposition: A | Discharge status: Home / Self Care | Payer: Medicaid Other | Source: Ambulatory Visit | Attending: Obstetrics & Gynecology | Admitting: Obstetrics & Gynecology

## 2014-07-25 DIAGNOSIS — Z3689 Encounter for other specified antenatal screening: Secondary | ICD-10-CM

## 2014-07-25 DIAGNOSIS — O09522 Supervision of elderly multigravida, second trimester: Secondary | ICD-10-CM | POA: Insufficient documentation

## 2014-07-25 DIAGNOSIS — O09529 Supervision of elderly multigravida, unspecified trimester: Secondary | ICD-10-CM

## 2014-07-25 DIAGNOSIS — O24312 Unspecified pre-existing diabetes mellitus in pregnancy, second trimester: Secondary | ICD-10-CM

## 2014-07-25 DIAGNOSIS — Z3A19 19 weeks gestation of pregnancy: Secondary | ICD-10-CM

## 2014-07-27 ENCOUNTER — Encounter: Payer: Self-pay | Admitting: Obstetrics & Gynecology

## 2014-07-28 ENCOUNTER — Encounter: Payer: Self-pay | Admitting: Obstetrics & Gynecology

## 2014-07-28 ENCOUNTER — Ambulatory Visit (INDEPENDENT_AMBULATORY_CARE_PROVIDER_SITE_OTHER): Payer: Self-pay | Admitting: Obstetrics & Gynecology

## 2014-07-28 VITALS — BP 133/71 | HR 92 | Temp 97.5°F | Wt 232.0 lb

## 2014-07-28 DIAGNOSIS — E119 Type 2 diabetes mellitus without complications: Secondary | ICD-10-CM

## 2014-07-28 DIAGNOSIS — O09892 Supervision of other high risk pregnancies, second trimester: Secondary | ICD-10-CM

## 2014-07-28 DIAGNOSIS — O24312 Unspecified pre-existing diabetes mellitus in pregnancy, second trimester: Secondary | ICD-10-CM

## 2014-07-28 DIAGNOSIS — O24419 Gestational diabetes mellitus in pregnancy, unspecified control: Secondary | ICD-10-CM

## 2014-07-28 MED ORDER — FLUCONAZOLE 150 MG PO TABS: 150.0000 mg | ORAL_TABLET | Freq: Once | ORAL | Status: DC

## 2014-07-28 MED ORDER — METFORMIN HCL 1000 MG PO TABS: ORAL_TABLET | ORAL | Status: DC

## 2014-07-28 NOTE — Progress Notes (Signed)
Possible yeast infection  Having trouble sleeping

## 2014-07-28 NOTE — Progress Notes (Signed)
Nml [place] Fasting:  92-101; 2 hr breakfast 101-120; 2 hr lunch 117-131; 2 hr dinner 112-157 Increase metformin to 1000 mg bid; will add glyburide as needed. Refuses NIPS.

## 2014-08-08 ENCOUNTER — Ambulatory Visit (INDEPENDENT_AMBULATORY_CARE_PROVIDER_SITE_OTHER): Payer: Self-pay | Admitting: Obstetrics & Gynecology

## 2014-08-08 ENCOUNTER — Encounter: Payer: Self-pay | Admitting: Obstetrics & Gynecology

## 2014-08-08 VITALS — BP 125/75 | HR 96 | Wt 232.0 lb

## 2014-08-08 DIAGNOSIS — O24319 Unspecified pre-existing diabetes mellitus in pregnancy, unspecified trimester: Secondary | ICD-10-CM

## 2014-08-08 DIAGNOSIS — O09529 Supervision of elderly multigravida, unspecified trimester: Secondary | ICD-10-CM

## 2014-08-08 DIAGNOSIS — O24919 Unspecified diabetes mellitus in pregnancy, unspecified trimester: Secondary | ICD-10-CM

## 2014-08-08 MED ORDER — GLYBURIDE 2.5 MG PO TABS: 2.5000 mg | ORAL_TABLET | Freq: Two times a day (BID) | ORAL | Status: DC

## 2014-08-08 NOTE — Progress Notes (Signed)
Pt states Pelvic pain is increasing.

## 2014-08-08 NOTE — Progress Notes (Signed)
Routine visit. Good FM. No OB problems. Still having back pain. She will return to her chiropractor. If this does not help, then I will refer her to PT. Her sugars are mostly all above the ideal range, so I will add glyburide 2.5 mg BID. She declines a flu vaccine.

## 2014-08-22 ENCOUNTER — Encounter: Payer: Self-pay | Admitting: Obstetrics & Gynecology

## 2014-09-20 ENCOUNTER — Ambulatory Visit (INDEPENDENT_AMBULATORY_CARE_PROVIDER_SITE_OTHER): Payer: Medicaid Other | Admitting: Physician Assistant

## 2014-09-20 ENCOUNTER — Encounter: Payer: Self-pay | Admitting: Physician Assistant

## 2014-09-20 VITALS — BP 132/70 | HR 104 | Wt 234.0 lb

## 2014-09-20 DIAGNOSIS — O24113 Pre-existing diabetes mellitus, type 2, in pregnancy, third trimester: Secondary | ICD-10-CM

## 2014-09-20 DIAGNOSIS — O09892 Supervision of other high risk pregnancies, second trimester: Secondary | ICD-10-CM

## 2014-09-20 DIAGNOSIS — O24319 Unspecified pre-existing diabetes mellitus in pregnancy, unspecified trimester: Secondary | ICD-10-CM

## 2014-09-20 MED ORDER — CYCLOBENZAPRINE HCL 10 MG PO TABS: 10.0000 mg | ORAL_TABLET | Freq: Every day | ORAL | Status: DC

## 2014-09-20 NOTE — Progress Notes (Signed)
Ketones-Mod 

## 2014-09-20 NOTE — Progress Notes (Signed)
27 weeks 6 days, pre-gestational diabetes.  Endorses good fetal movement.  Denies LOF, vaginal bleeding, dysuria.  Pt taking glyburide twice daily.  She reports good blood sugar control but has forgotten her diary.  She is seeing chiropractor for back/hip pain. RTC 2 weeks.

## 2014-09-20 NOTE — Patient Instructions (Signed)
Breastfeeding Deciding to breastfeed is one of the best choices you can make for you and your baby. A change in hormones during pregnancy causes your breast tissue to grow and increases the number and size of your milk ducts. These hormones also allow proteins, sugars, and fats from your blood supply to make breast milk in your milk-producing glands. Hormones prevent breast milk from being released before your baby is born as well as prompt milk flow after birth. Once breastfeeding has begun, thoughts of your baby, as well as his or her sucking or crying, can stimulate the release of milk from your milk-producing glands.  BENEFITS OF BREASTFEEDING For Your Baby  Your first milk (colostrum) helps your baby's digestive system function better.   There are antibodies in your milk that help your baby fight off infections.   Your baby has a lower incidence of asthma, allergies, and sudden infant death syndrome.   The nutrients in breast milk are better for your baby than infant formulas and are designed uniquely for your baby's needs.   Breast milk improves your baby's brain development.   Your baby is less likely to develop other conditions, such as childhood obesity, asthma, or type 2 diabetes mellitus.  For You   Breastfeeding helps to create a very special bond between you and your baby.   Breastfeeding is convenient. Breast milk is always available at the correct temperature and costs nothing.   Breastfeeding helps to burn calories and helps you lose the weight gained during pregnancy.   Breastfeeding makes your uterus contract to its prepregnancy size faster and slows bleeding (lochia) after you give birth.   Breastfeeding helps to lower your risk of developing type 2 diabetes mellitus, osteoporosis, and breast or ovarian cancer later in life. SIGNS THAT YOUR BABY IS HUNGRY Early Signs of Hunger  Increased alertness or activity.  Stretching.  Movement of the head from  side to side.  Movement of the head and opening of the mouth when the corner of the mouth or cheek is stroked (rooting).  Increased sucking sounds, smacking lips, cooing, sighing, or squeaking.  Hand-to-mouth movements.  Increased sucking of fingers or hands. Late Signs of Hunger  Fussing.  Intermittent crying. Extreme Signs of Hunger Signs of extreme hunger will require calming and consoling before your baby will be able to breastfeed successfully. Do not wait for the following signs of extreme hunger to occur before you initiate breastfeeding:   Restlessness.  A loud, strong cry.   Screaming. BREASTFEEDING BASICS Breastfeeding Initiation  Find a comfortable place to sit or lie down, with your neck and back well supported.  Place a pillow or rolled up blanket under your baby to bring him or her to the level of your breast (if you are seated). Nursing pillows are specially designed to help support your arms and your baby while you breastfeed.  Make sure that your baby's abdomen is facing your abdomen.   Gently massage your breast. With your fingertips, massage from your chest wall toward your nipple in a circular motion. This encourages milk flow. You may need to continue this action during the feeding if your milk flows slowly.  Support your breast with 4 fingers underneath and your thumb above your nipple. Make sure your fingers are well away from your nipple and your baby's mouth.   Stroke your baby's lips gently with your finger or nipple.   When your baby's mouth is open wide enough, quickly bring your baby to your   breast, placing your entire nipple and as much of the colored area around your nipple (areola) as possible into your baby's mouth.   More areola should be visible above your baby's upper lip than below the lower lip.   Your baby's tongue should be between his or her lower gum and your breast.   Ensure that your baby's mouth is correctly positioned  around your nipple (latched). Your baby's lips should create a seal on your breast and be turned out (everted).  It is common for your baby to suck about 2-3 minutes in order to start the flow of breast milk. Latching Teaching your baby how to latch on to your breast properly is very important. An improper latch can cause nipple pain and decreased milk supply for you and poor weight gain in your baby. Also, if your baby is not latched onto your nipple properly, he or she may swallow some air during feeding. This can make your baby fussy. Burping your baby when you switch breasts during the feeding can help to get rid of the air. However, teaching your baby to latch on properly is still the best way to prevent fussiness from swallowing air while breastfeeding. Signs that your baby has successfully latched on to your nipple:    Silent tugging or silent sucking, without causing you pain.   Swallowing heard between every 3-4 sucks.    Muscle movement above and in front of his or her ears while sucking.  Signs that your baby has not successfully latched on to nipple:   Sucking sounds or smacking sounds from your baby while breastfeeding.  Nipple pain. If you think your baby has not latched on correctly, slip your finger into the corner of your baby's mouth to break the suction and place it between your baby's gums. Attempt breastfeeding initiation again. Signs of Successful Breastfeeding Signs from your baby:   A gradual decrease in the number of sucks or complete cessation of sucking.   Falling asleep.   Relaxation of his or her body.   Retention of a small amount of milk in his or her mouth.   Letting go of your breast by himself or herself. Signs from you:  Breasts that have increased in firmness, weight, and size 1-3 hours after feeding.   Breasts that are softer immediately after breastfeeding.  Increased milk volume, as well as a change in milk consistency and color by  the fifth day of breastfeeding.   Nipples that are not sore, cracked, or bleeding. Signs That Your Baby is Getting Enough Milk  Wetting at least 3 diapers in a 24-hour period. The urine should be clear and pale yellow by age 5 days.  At least 3 stools in a 24-hour period by age 5 days. The stool should be soft and yellow.  At least 3 stools in a 24-hour period by age 7 days. The stool should be seedy and yellow.  No loss of weight greater than 10% of birth weight during the first 3 days of age.  Average weight gain of 4-7 ounces (113-198 g) per week after age 4 days.  Consistent daily weight gain by age 5 days, without weight loss after the age of 2 weeks. After a feeding, your baby may spit up a small amount. This is common. BREASTFEEDING FREQUENCY AND DURATION Frequent feeding will help you make more milk and can prevent sore nipples and breast engorgement. Breastfeed when you feel the need to reduce the fullness of your breasts   or when your baby shows signs of hunger. This is called "breastfeeding on demand." Avoid introducing a pacifier to your baby while you are working to establish breastfeeding (the first 4-6 weeks after your baby is born). After this time you may choose to use a pacifier. Research has shown that pacifier use during the first year of a baby's life decreases the risk of sudden infant death syndrome (SIDS). Allow your baby to feed on each breast as long as he or she wants. Breastfeed until your baby is finished feeding. When your baby unlatches or falls asleep while feeding from the first breast, offer the second breast. Because newborns are often sleepy in the first few weeks of life, you may need to awaken your baby to get him or her to feed. Breastfeeding times will vary from baby to baby. However, the following rules can serve as a guide to help you ensure that your baby is properly fed:  Newborns (babies 4 weeks of age or younger) may breastfeed every 1-3  hours.  Newborns should not go longer than 3 hours during the day or 5 hours during the night without breastfeeding.  You should breastfeed your baby a minimum of 8 times in a 24-hour period until you begin to introduce solid foods to your baby at around 6 months of age. BREAST MILK PUMPING Pumping and storing breast milk allows you to ensure that your baby is exclusively fed your breast milk, even at times when you are unable to breastfeed. This is especially important if you are going back to work while you are still breastfeeding or when you are not able to be present during feedings. Your lactation consultant can give you guidelines on how long it is safe to store breast milk.  A breast pump is a machine that allows you to pump milk from your breast into a sterile bottle. The pumped breast milk can then be stored in a refrigerator or freezer. Some breast pumps are operated by hand, while others use electricity. Ask your lactation consultant which type will work best for you. Breast pumps can be purchased, but some hospitals and breastfeeding support groups lease breast pumps on a monthly basis. A lactation consultant can teach you how to hand express breast milk, if you prefer not to use a pump.  CARING FOR YOUR BREASTS WHILE YOU BREASTFEED Nipples can become dry, cracked, and sore while breastfeeding. The following recommendations can help keep your breasts moisturized and healthy:  Avoid using soap on your nipples.   Wear a supportive bra. Although not required, special nursing bras and tank tops are designed to allow access to your breasts for breastfeeding without taking off your entire bra or top. Avoid wearing underwire-style bras or extremely tight bras.  Air dry your nipples for 3-4minutes after each feeding.   Use only cotton bra pads to absorb leaked breast milk. Leaking of breast milk between feedings is normal.   Use lanolin on your nipples after breastfeeding. Lanolin helps to  maintain your skin's normal moisture barrier. If you use pure lanolin, you do not need to wash it off before feeding your baby again. Pure lanolin is not toxic to your baby. You may also hand express a few drops of breast milk and gently massage that milk into your nipples and allow the milk to air dry. In the first few weeks after giving birth, some women experience extremely full breasts (engorgement). Engorgement can make your breasts feel heavy, warm, and tender to the   touch. Engorgement peaks within 3-5 days after you give birth. The following recommendations can help ease engorgement:  Completely empty your breasts while breastfeeding or pumping. You may want to start by applying warm, moist heat (in the shower or with warm water-soaked hand towels) just before feeding or pumping. This increases circulation and helps the milk flow. If your baby does not completely empty your breasts while breastfeeding, pump any extra milk after he or she is finished.  Wear a snug bra (nursing or regular) or tank top for 1-2 days to signal your body to slightly decrease milk production.  Apply ice packs to your breasts, unless this is too uncomfortable for you.  Make sure that your baby is latched on and positioned properly while breastfeeding. If engorgement persists after 48 hours of following these recommendations, contact your health care provider or a lactation consultant. OVERALL HEALTH CARE RECOMMENDATIONS WHILE BREASTFEEDING  Eat healthy foods. Alternate between meals and snacks, eating 3 of each per day. Because what you eat affects your breast milk, some of the foods may make your baby more irritable than usual. Avoid eating these foods if you are sure that they are negatively affecting your baby.  Drink milk, fruit juice, and water to satisfy your thirst (about 10 glasses a day).   Rest often, relax, and continue to take your prenatal vitamins to prevent fatigue, stress, and anemia.  Continue  breast self-awareness checks.  Avoid chewing and smoking tobacco.  Avoid alcohol and drug use. Some medicines that may be harmful to your baby can pass through breast milk. It is important to ask your health care provider before taking any medicine, including all over-the-counter and prescription medicine as well as vitamin and herbal supplements. It is possible to become pregnant while breastfeeding. If birth control is desired, ask your health care provider about options that will be safe for your baby. SEEK MEDICAL CARE IF:   You feel like you want to stop breastfeeding or have become frustrated with breastfeeding.  You have painful breasts or nipples.  Your nipples are cracked or bleeding.  Your breasts are red, tender, or warm.  You have a swollen area on either breast.  You have a fever or chills.  You have nausea or vomiting.  You have drainage other than breast milk from your nipples.  Your breasts do not become full before feedings by the fifth day after you give birth.  You feel sad and depressed.  Your baby is too sleepy to eat well.  Your baby is having trouble sleeping.   Your baby is wetting less than 3 diapers in a 24-hour period.  Your baby has less than 3 stools in a 24-hour period.  Your baby's skin or the white part of his or her eyes becomes yellow.   Your baby is not gaining weight by 5 days of age. SEEK IMMEDIATE MEDICAL CARE IF:   Your baby is overly tired (lethargic) and does not want to wake up and feed.  Your baby develops an unexplained fever. Document Released: 10/07/2005 Document Revised: 10/12/2013 Document Reviewed: 03/31/2013 [employer] Patient Information 2015 [employer]. This information is not intended to replace advice given to you by your health care provider. Make sure you discuss any questions you have with your health care provider.  

## 2014-09-21 LAB — CBC
HEMATOCRIT: 37.1 % (ref 36.0–46.0)
HEMOGLOBIN: 12.4 g/dL (ref 12.0–15.0)
MCH: 29.1 pg (ref 26.0–34.0)
MCHC: 33.4 g/dL (ref 30.0–36.0)
MCV: 87.1 fL (ref 78.0–100.0)
MPV: 10.3 fL (ref 9.4–12.4)
Platelets: 187 10*3/uL (ref 150–400)
RBC: 4.26 MIL/uL (ref 3.87–5.11)
RDW: 14.5 % (ref 11.5–15.5)
WBC: 9.7 10*3/uL (ref 4.0–10.5)

## 2014-09-21 LAB — HIV ANTIBODY (ROUTINE TESTING W REFLEX): HIV 1&2 Ab, 4th Generation: NONREACTIVE

## 2014-09-21 LAB — RPR

## 2014-10-04 ENCOUNTER — Other Ambulatory Visit: Payer: Self-pay | Admitting: *Deleted

## 2014-10-04 ENCOUNTER — Encounter: Payer: Self-pay | Admitting: Obstetrics & Gynecology

## 2014-10-04 ENCOUNTER — Ambulatory Visit (INDEPENDENT_AMBULATORY_CARE_PROVIDER_SITE_OTHER): Payer: Medicaid Other | Admitting: Obstetrics & Gynecology

## 2014-10-04 VITALS — BP 130/62 | HR 98 | Wt 234.0 lb

## 2014-10-04 DIAGNOSIS — O99891 Other specified diseases and conditions complicating pregnancy: Secondary | ICD-10-CM

## 2014-10-04 DIAGNOSIS — E119 Type 2 diabetes mellitus without complications: Secondary | ICD-10-CM

## 2014-10-04 DIAGNOSIS — O24414 Gestational diabetes mellitus in pregnancy, insulin controlled: Secondary | ICD-10-CM

## 2014-10-04 DIAGNOSIS — O24313 Unspecified pre-existing diabetes mellitus in pregnancy, third trimester: Secondary | ICD-10-CM

## 2014-10-04 DIAGNOSIS — O09529 Supervision of elderly multigravida, unspecified trimester: Secondary | ICD-10-CM

## 2014-10-04 DIAGNOSIS — O9989 Other specified diseases and conditions complicating pregnancy, childbirth and the puerperium: Secondary | ICD-10-CM

## 2014-10-04 DIAGNOSIS — O09893 Supervision of other high risk pregnancies, third trimester: Secondary | ICD-10-CM

## 2014-10-04 DIAGNOSIS — M549 Dorsalgia, unspecified: Secondary | ICD-10-CM

## 2014-10-04 MED ORDER — ACCU-CHEK FASTCLIX LANCETS MISC: 1.0000 | Freq: Four times a day (QID) | Status: DC

## 2014-10-04 MED ORDER — GLUCOSE BLOOD VI STRP: ORAL_STRIP | Status: DC

## 2014-10-04 MED ORDER — GLYBURIDE 2.5 MG PO TABS: 5.0000 mg | ORAL_TABLET | Freq: Two times a day (BID) | ORAL | Status: DC

## 2014-10-04 MED ORDER — METFORMIN HCL 1000 MG PO TABS: ORAL_TABLET | ORAL | Status: DC

## 2014-10-04 MED ORDER — FREESTYLE SYSTEM KIT: 1.0000 | PACK | Status: DC | PRN

## 2014-10-04 NOTE — Progress Notes (Signed)
She is requesting a referral to PT as the chiro is helping but is costing her $100 per month.

## 2014-10-04 NOTE — Progress Notes (Signed)
Fastings [id number],98,97,107,111,112,94,87,88

## 2014-10-04 NOTE — Addendum Note (Signed)
Addended by: Granville LewisLARK, Chinonso Linker L on: 10/04/2014 03:56 PM   Modules accepted: Orders

## 2014-10-04 NOTE — Progress Notes (Signed)
Routine visit. Good FM. She noted bad sugars last week, so she increased her glyburide from 2.5 to 5 mg BID. Sugars look great now! I did discuss that as pregnancy progresses, she may need insulin.:( I will schedule her a growth follow up u/s.

## 2014-10-04 NOTE — Addendum Note (Signed)
Addended by: Granville LewisLARK, Kamri Gotsch L on: 10/04/2014 03:53 PM   Modules accepted: Orders

## 2014-10-04 NOTE — Addendum Note (Signed)
Addended by: Allie BossierVE, Alexie Lanni C on: 10/04/2014 02:03 PM   Modules accepted: Orders

## 2014-10-04 NOTE — Addendum Note (Signed)
Addended by: Granville LewisLARK, Bernetta Sutley L on: 10/04/2014 03:57 PM   Modules accepted: Orders

## 2014-10-04 NOTE — Telephone Encounter (Signed)
[facility] per Dr Marice Potterove.

## 2014-10-18 ENCOUNTER — Ambulatory Visit (INDEPENDENT_AMBULATORY_CARE_PROVIDER_SITE_OTHER): Payer: Medicaid Other | Admitting: Obstetrics & Gynecology

## 2014-10-18 VITALS — BP 130/82 | HR 104 | Wt 238.0 lb

## 2014-10-18 DIAGNOSIS — O24313 Unspecified pre-existing diabetes mellitus in pregnancy, third trimester: Secondary | ICD-10-CM

## 2014-10-18 DIAGNOSIS — O24913 Unspecified diabetes mellitus in pregnancy, third trimester: Secondary | ICD-10-CM

## 2014-10-18 DIAGNOSIS — O24419 Gestational diabetes mellitus in pregnancy, unspecified control: Secondary | ICD-10-CM

## 2014-10-18 LAB — GLUCOSE, POCT (MANUAL RESULT ENTRY): POC Glucose: 146 mg/dl — AB (ref 70–99)

## 2014-10-18 MED ORDER — INSULIN SYRINGE-NEEDLE U-100 30G 1 ML MISC: 1.0000 | Freq: Three times a day (TID) | Status: DC

## 2014-10-18 MED ORDER — INSULIN NPH (HUMAN) (ISOPHANE) 100 UNIT/ML ~~LOC~~ SUSP: SUBCUTANEOUS | Status: DC

## 2014-10-18 MED ORDER — INSULIN REGULAR HUMAN 100 UNIT/ML IJ SOLN: INTRAMUSCULAR | Status: DC

## 2014-10-18 NOTE — Progress Notes (Signed)
Fastings still 130s and post prandials are 180s.  Using weight based formula, patient planced on NPH 40 q am, Reg 20 q am, reg 16 b/f dinner, and NPH 16 b/f bedtime.  Pt has taken insuln before and remembers being at these numbers before.  Patient to call tomorrow with her numbers (holiday weekend approaching).  Needs BPP as she is now going to be 32 weeks this week. Will be seen weekly.  Call with any questions or high/low CBGs

## 2014-10-18 NOTE — Progress Notes (Signed)
Pt states fasting CBG has been in the 130 range and will not go below 130 through out the day. Pt thinks medication dosage might need to be changed. Last meal at 11:00am. CBG at 3:11pm was 146 in office.

## 2014-10-19 ENCOUNTER — Telehealth: Payer: Self-pay | Admitting: *Deleted

## 2014-10-19 NOTE — Telephone Encounter (Signed)
Called pt at 3:45pm and again at 5:19pm. LMOM for pt to rtn call to my cell to adv how sugars are doing since beginning insulin.

## 2014-10-20 ENCOUNTER — Ambulatory Visit (HOSPITAL_COMMUNITY)
Admission: RE | Admit: 2014-10-20 | Discharge: 2014-10-20 | Disposition: A | Discharge status: Home / Self Care | Payer: Medicaid Other | Source: Ambulatory Visit | Attending: Obstetrics & Gynecology | Admitting: Obstetrics & Gynecology

## 2014-10-20 DIAGNOSIS — O24414 Gestational diabetes mellitus in pregnancy, insulin controlled: Secondary | ICD-10-CM | POA: Insufficient documentation

## 2014-10-20 DIAGNOSIS — O09299 Supervision of pregnancy with other poor reproductive or obstetric history, unspecified trimester: Secondary | ICD-10-CM | POA: Insufficient documentation

## 2014-10-20 DIAGNOSIS — O352XX0 Maternal care for (suspected) hereditary disease in fetus, not applicable or unspecified: Secondary | ICD-10-CM | POA: Insufficient documentation

## 2014-10-20 DIAGNOSIS — Z3A32 32 weeks gestation of pregnancy: Secondary | ICD-10-CM | POA: Insufficient documentation

## 2014-10-20 DIAGNOSIS — O99210 Obesity complicating pregnancy, unspecified trimester: Secondary | ICD-10-CM | POA: Diagnosis not present

## 2014-10-20 DIAGNOSIS — O24313 Unspecified pre-existing diabetes mellitus in pregnancy, third trimester: Secondary | ICD-10-CM

## 2014-10-20 DIAGNOSIS — O24419 Gestational diabetes mellitus in pregnancy, unspecified control: Secondary | ICD-10-CM

## 2014-10-20 DIAGNOSIS — O09523 Supervision of elderly multigravida, third trimester: Secondary | ICD-10-CM | POA: Diagnosis not present

## 2014-10-20 DIAGNOSIS — O337 Maternal care for disproportion due to other fetal deformities: Secondary | ICD-10-CM | POA: Insufficient documentation

## 2014-10-21 NOTE — L&D Delivery Note (Signed)
Mother is a [id number] @ [id number] who delivered after IOL 2/2 preeclampsia w/ severe features. Patient refused Magnesium throughout labor. Labor complicated by an acute asthma exacerbation which was controlled with xoponex and methylprednisolone. IOL accomplished w/ pitocin only. Patient at increased risk for [facility] >> no bleeding complication experienced.  Delivery Note At 7:12 PM a viable female was delivered via Vaginal, Spontaneous Delivery (Presentation: Left Occiput Anterior).  APGAR: 7, 9; weight  .   Placenta status: Intact, Spontaneous.  Cord: 3 vessels with the following complications: None.   Anesthesia: Epidural  Episiotomy: None Lacerations: None Suture Repair: n/a Est. Blood Loss (mL):  100ml  Mom to postpartum.  Baby to Couplet care / Skin to Skin.  Kathee DeltonMcKeag, Ian D 11/22/2014, 7:49 PM

## 2014-10-22 DIAGNOSIS — O24419 Gestational diabetes mellitus in pregnancy, unspecified control: Secondary | ICD-10-CM | POA: Insufficient documentation

## 2014-10-22 DIAGNOSIS — O24414 Gestational diabetes mellitus in pregnancy, insulin controlled: Secondary | ICD-10-CM | POA: Insufficient documentation

## 2014-10-22 DIAGNOSIS — Z3A32 32 weeks gestation of pregnancy: Secondary | ICD-10-CM | POA: Insufficient documentation

## 2014-10-24 ENCOUNTER — Other Ambulatory Visit: Payer: Medicaid Other

## 2014-10-24 ENCOUNTER — Ambulatory Visit (INDEPENDENT_AMBULATORY_CARE_PROVIDER_SITE_OTHER): Payer: Medicaid Other | Admitting: Obstetrics & Gynecology

## 2014-10-24 ENCOUNTER — Telehealth: Payer: Self-pay | Admitting: *Deleted

## 2014-10-24 VITALS — BP 144/82 | HR 89 | Wt 238.0 lb

## 2014-10-24 DIAGNOSIS — O24414 Gestational diabetes mellitus in pregnancy, insulin controlled: Secondary | ICD-10-CM

## 2014-10-24 DIAGNOSIS — Z3493 Encounter for supervision of normal pregnancy, unspecified, third trimester: Secondary | ICD-10-CM

## 2014-10-24 DIAGNOSIS — B373 Candidiasis of vulva and vagina: Secondary | ICD-10-CM

## 2014-10-24 DIAGNOSIS — B3731 Acute candidiasis of vulva and vagina: Secondary | ICD-10-CM

## 2014-10-24 MED ORDER — METFORMIN HCL 1000 MG PO TABS: 1000.0000 mg | ORAL_TABLET | Freq: Two times a day (BID) | ORAL | Status: DC

## 2014-10-24 MED ORDER — FLUCONAZOLE 150 MG PO TABS: 150.0000 mg | ORAL_TABLET | Freq: Once | ORAL | Status: DC

## 2014-10-24 NOTE — Telephone Encounter (Signed)
Pt states she has increased doses once again except for morning NPH. 10/23/13 pt states CBG readings were fasting 127, bfast 136, lunch 148 and dinner 112. Pt is scheduled to come in today for NST and will go over CBG's with Dr. Penne Lash at that time.

## 2014-10-24 NOTE — Telephone Encounter (Signed)
Spoke to pt several times over the weekend about CBG's. On 10/20/14 Pt states fasting CBG 112, bfast 136, lunch 148 and dinner 167 so pt increased medication dose from  N40, bfast R20, dinner R16, hs N20 to N42, bfast R22, dinner R22, hs N22. On 10/21/14 fasting CBG was 118, bfast 141, lunch 139, dinner 148. On 10/22/13 pt had fasting CBG 156 and she added back in  Metformin. Dr Penne Lash sent a message which was forwarded to pt to do meds as follows: 46 MPH am, 24 reg bfast, 24 reg dinner, 26 NPH at bedtime and to add back in Metformin. Pt expressed understanding.

## 2014-10-24 NOTE — Progress Notes (Signed)
Elevated BP.  Will check for preeclampsia given high risk factors.  Already in testing and getting growth [place].                                                  NPH 46 q am Reg b/f breakfast 26 Reg b/s dinner 26 NPH qhs 28  Fasting today today 127 Metformin started 1000 mg bid yesterday  Change NPH qhs to 30 units (is eating bedtime snack) PP are 112-140; Will keep the same as she added metformin

## 2014-10-24 NOTE — Progress Notes (Signed)
NST today and follow up CBG readings since starting insulin. Pt needs refill of Diflucan and Metformin. Fasting CBGs are 112, 118, 156, 127. BP recheck 124/80

## 2014-10-27 ENCOUNTER — Encounter: Payer: Self-pay | Admitting: *Deleted

## 2014-10-27 ENCOUNTER — Ambulatory Visit: Payer: Medicaid Other | Admitting: *Deleted

## 2014-10-27 DIAGNOSIS — O24414 Gestational diabetes mellitus in pregnancy, insulin controlled: Secondary | ICD-10-CM

## 2014-10-27 LAB — CBC
HCT: 37.2 % (ref 36.0–46.0)
Hemoglobin: 12.2 g/dL (ref 12.0–15.0)
MCH: 28.4 pg (ref 26.0–34.0)
MCHC: 32.8 g/dL (ref 30.0–36.0)
MCV: 86.5 fL (ref 78.0–100.0)
MPV: 10.1 fL (ref 8.6–12.4)
Platelets: 188 10*3/uL (ref 150–400)
RBC: 4.3 MIL/uL (ref 3.87–5.11)
RDW: 15.1 % (ref 11.5–15.5)
WBC: 10.3 10*3/uL (ref 4.0–10.5)

## 2014-10-27 LAB — CREATININE CLEARANCE, URINE, 24 HOUR
CREATININE 24H UR: 1150 mg/d (ref 700–1800)
Creatinine Clearance: 222 mL/min — ABNORMAL HIGH (ref 75–115)
Creatinine, Urine: 26.1 mg/dL
Creatinine: 0.36 mg/dL — ABNORMAL LOW (ref 0.50–1.10)

## 2014-10-27 LAB — COMPREHENSIVE METABOLIC PANEL
ALT: 17 U/L (ref 0–35)
AST: 15 U/L (ref 0–37)
Albumin: 3.2 g/dL — ABNORMAL LOW (ref 3.5–5.2)
Alkaline Phosphatase: 82 U/L (ref 39–117)
BUN: 9 mg/dL (ref 6–23)
CALCIUM: 9.1 mg/dL (ref 8.4–10.5)
CO2: 23 meq/L (ref 19–32)
CREATININE: 0.36 mg/dL — AB (ref 0.50–1.10)
Chloride: 103 mEq/L (ref 96–112)
Glucose, Bld: 75 mg/dL (ref 70–99)
Potassium: 4.3 mEq/L (ref 3.5–5.3)
SODIUM: 135 meq/L (ref 135–145)
TOTAL PROTEIN: 6.3 g/dL (ref 6.0–8.3)
Total Bilirubin: 0.3 mg/dL (ref 0.2–1.2)

## 2014-10-27 LAB — PROTEIN, URINE, 24 HOUR

## 2014-10-27 MED ORDER — INSULIN REGULAR HUMAN 100 UNIT/ML IJ SOLN: INTRAMUSCULAR | Status: DC

## 2014-10-27 MED ORDER — INSULIN NPH (HUMAN) (ISOPHANE) 100 UNIT/ML ~~LOC~~ SUSP: SUBCUTANEOUS | Status: DC

## 2014-10-31 ENCOUNTER — Ambulatory Visit (INDEPENDENT_AMBULATORY_CARE_PROVIDER_SITE_OTHER): Payer: Medicaid Other | Admitting: Advanced Practice Midwife

## 2014-10-31 VITALS — BP 137/62 | HR 93 | Wt 240.0 lb

## 2014-10-31 DIAGNOSIS — B3731 Acute candidiasis of vulva and vagina: Secondary | ICD-10-CM

## 2014-10-31 DIAGNOSIS — E139 Other specified diabetes mellitus without complications: Secondary | ICD-10-CM

## 2014-10-31 DIAGNOSIS — B373 Candidiasis of vulva and vagina: Secondary | ICD-10-CM

## 2014-10-31 DIAGNOSIS — O24414 Gestational diabetes mellitus in pregnancy, insulin controlled: Secondary | ICD-10-CM

## 2014-10-31 DIAGNOSIS — Z3483 Encounter for supervision of other normal pregnancy, third trimester: Secondary | ICD-10-CM

## 2014-10-31 DIAGNOSIS — O24113 Pre-existing diabetes mellitus, type 2, in pregnancy, third trimester: Secondary | ICD-10-CM

## 2014-10-31 DIAGNOSIS — O24313 Unspecified pre-existing diabetes mellitus in pregnancy, third trimester: Secondary | ICD-10-CM

## 2014-10-31 MED ORDER — TERCONAZOLE 0.4 % VA CREA: 1.0000 | TOPICAL_CREAM | Freq: Every day | VAGINAL | Status: DC

## 2014-10-31 MED ORDER — INSULIN NPH (HUMAN) (ISOPHANE) 100 UNIT/ML ~~LOC~~ SUSP: SUBCUTANEOUS | Status: DC

## 2014-10-31 NOTE — Progress Notes (Signed)
Pt states she thinks she may have another yeast infection - current vaginal discharge is "sandy colored".

## 2014-10-31 NOTE — Progress Notes (Signed)
Fasting 102-109 w/ change to NPH 30 Units at night. Will increase 2 more Units to 32 Units QHS. Recommend IOL at 39 weeks due to Type II DM on insulin. Pt refuses. R/B/I reviewed. NST reactive. Pre-E labs normal last visit. Asymptomatic.

## 2014-10-31 NOTE — Patient Instructions (Signed)
Change night time NPH insulin to 32 Units.

## 2014-11-04 ENCOUNTER — Other Ambulatory Visit: Payer: Medicaid Other

## 2014-11-07 ENCOUNTER — Encounter: Payer: Medicaid Other | Admitting: Advanced Practice Midwife

## 2014-11-08 ENCOUNTER — Encounter: Payer: Self-pay | Admitting: *Deleted

## 2014-11-08 ENCOUNTER — Ambulatory Visit (INDEPENDENT_AMBULATORY_CARE_PROVIDER_SITE_OTHER): Payer: Medicaid Other | Admitting: Obstetrics & Gynecology

## 2014-11-08 VITALS — Wt 239.0 lb

## 2014-11-08 DIAGNOSIS — O24913 Unspecified diabetes mellitus in pregnancy, third trimester: Secondary | ICD-10-CM

## 2014-11-08 DIAGNOSIS — Z3483 Encounter for supervision of other normal pregnancy, third trimester: Secondary | ICD-10-CM

## 2014-11-08 DIAGNOSIS — O24313 Unspecified pre-existing diabetes mellitus in pregnancy, third trimester: Secondary | ICD-10-CM

## 2014-11-08 NOTE — Progress Notes (Signed)
Routine visit. Good FM. Excellent sugars. NST reviewed and reactive. She has a growth u/s scheduled.

## 2014-11-08 NOTE — Progress Notes (Signed)
Fasting CBG  [phone] [id number],94

## 2014-11-11 ENCOUNTER — Other Ambulatory Visit: Payer: Medicaid Other

## 2014-11-14 ENCOUNTER — Ambulatory Visit (INDEPENDENT_AMBULATORY_CARE_PROVIDER_SITE_OTHER): Payer: Medicaid Other | Admitting: Advanced Practice Midwife

## 2014-11-14 ENCOUNTER — Other Ambulatory Visit: Payer: Self-pay | Admitting: Advanced Practice Midwife

## 2014-11-14 ENCOUNTER — Encounter: Payer: Self-pay | Admitting: Advanced Practice Midwife

## 2014-11-14 VITALS — BP 145/70 | HR 97 | Wt 241.0 lb

## 2014-11-14 DIAGNOSIS — O24913 Unspecified diabetes mellitus in pregnancy, third trimester: Secondary | ICD-10-CM

## 2014-11-14 DIAGNOSIS — O24414 Gestational diabetes mellitus in pregnancy, insulin controlled: Secondary | ICD-10-CM

## 2014-11-14 DIAGNOSIS — O24313 Unspecified pre-existing diabetes mellitus in pregnancy, third trimester: Secondary | ICD-10-CM

## 2014-11-14 DIAGNOSIS — Z3483 Encounter for supervision of other normal pregnancy, third trimester: Secondary | ICD-10-CM

## 2014-11-14 DIAGNOSIS — Z36 Encounter for antenatal screening of mother: Secondary | ICD-10-CM

## 2014-11-14 DIAGNOSIS — O24113 Pre-existing diabetes mellitus, type 2, in pregnancy, third trimester: Secondary | ICD-10-CM

## 2014-11-14 LAB — CBC
HCT: 35.6 % — ABNORMAL LOW (ref 36.0–46.0)
HEMOGLOBIN: 11.9 g/dL — AB (ref 12.0–15.0)
MCH: 28.5 pg (ref 26.0–34.0)
MCHC: 33.4 g/dL (ref 30.0–36.0)
MCV: 85.2 fL (ref 78.0–100.0)
MPV: 10.7 fL (ref 8.6–12.4)
Platelets: 162 10*3/uL (ref 150–400)
RBC: 4.18 MIL/uL (ref 3.87–5.11)
RDW: 15.3 % (ref 11.5–15.5)
WBC: 11.1 10*3/uL — ABNORMAL HIGH (ref 4.0–10.5)

## 2014-11-14 LAB — OB RESULTS CONSOLE GC/CHLAMYDIA
Chlamydia: NEGATIVE
GC PROBE AMP, GENITAL: NEGATIVE

## 2014-11-15 ENCOUNTER — Telehealth: Payer: Self-pay | Admitting: *Deleted

## 2014-11-15 LAB — GC/CHLAMYDIA PROBE AMP
CT Probe RNA: NEGATIVE
GC PROBE AMP APTIMA: NEGATIVE

## 2014-11-15 LAB — OB RESULTS CONSOLE GBS: GBS: NEGATIVE

## 2014-11-15 NOTE — Telephone Encounter (Signed)
Pt notified of Hgb results.  She had been craving ice and wanted to have her lab checked.  She is taking her prenatal vitamins.

## 2014-11-16 ENCOUNTER — Ambulatory Visit (HOSPITAL_COMMUNITY)
Admission: RE | Admit: 2014-11-16 | Discharge: 2014-11-16 | Disposition: A | Discharge status: Home / Self Care | Payer: Medicaid Other | Source: Ambulatory Visit | Attending: Obstetrics & Gynecology | Admitting: Obstetrics & Gynecology

## 2014-11-16 ENCOUNTER — Encounter (HOSPITAL_COMMUNITY): Payer: Self-pay

## 2014-11-16 ENCOUNTER — Other Ambulatory Visit: Payer: Self-pay | Admitting: Advanced Practice Midwife

## 2014-11-16 DIAGNOSIS — O09523 Supervision of elderly multigravida, third trimester: Secondary | ICD-10-CM | POA: Insufficient documentation

## 2014-11-16 DIAGNOSIS — O09293 Supervision of pregnancy with other poor reproductive or obstetric history, third trimester: Secondary | ICD-10-CM | POA: Insufficient documentation

## 2014-11-16 DIAGNOSIS — O24113 Pre-existing diabetes mellitus, type 2, in pregnancy, third trimester: Secondary | ICD-10-CM

## 2014-11-16 DIAGNOSIS — O99213 Obesity complicating pregnancy, third trimester: Secondary | ICD-10-CM | POA: Insufficient documentation

## 2014-11-16 DIAGNOSIS — Z3A36 36 weeks gestation of pregnancy: Secondary | ICD-10-CM | POA: Diagnosis not present

## 2014-11-16 DIAGNOSIS — E119 Type 2 diabetes mellitus without complications: Secondary | ICD-10-CM | POA: Insufficient documentation

## 2014-11-16 DIAGNOSIS — O24414 Gestational diabetes mellitus in pregnancy, insulin controlled: Secondary | ICD-10-CM | POA: Diagnosis present

## 2014-11-16 LAB — CULTURE, BETA STREP (GROUP B ONLY)

## 2014-11-16 NOTE — Progress Notes (Signed)
Did not have list of sugars. States "a few were a bit high so I gave myself a little extra insulin".  Denies pain or bleeding. NST reviewed, reactive. Continue weekly visits.  Has US with BPP scheduled for 1/27.

## 2014-11-16 NOTE — Patient Instructions (Signed)
Third Trimester of Pregnancy The third trimester is from week 29 through week 42, months 7 through 9. The third trimester is a time when the fetus is growing rapidly. At the end of the ninth month, the fetus is about 20 inches in length and weighs 6-10 pounds.  BODY CHANGES Your body goes through many changes during pregnancy. The changes vary from woman to woman.   Your weight will continue to increase. You can expect to gain 25-35 pounds (11-16 kg) by the end of the pregnancy.  You may begin to get stretch marks on your hips, abdomen, and breasts.  You may urinate more often because the fetus is moving lower into your pelvis and pressing on your bladder.  You may develop or continue to have heartburn as a result of your pregnancy.  You may develop constipation because certain hormones are causing the muscles that push waste through your intestines to slow down.  You may develop hemorrhoids or swollen, bulging veins (varicose veins).  You may have pelvic pain because of the weight gain and pregnancy hormones relaxing your joints between the bones in your pelvis. Backaches may result from overexertion of the muscles supporting your posture.  You may have changes in your hair. These can include thickening of your hair, rapid growth, and changes in texture. Some women also have hair loss during or after pregnancy, or hair that feels dry or thin. Your hair will most likely return to normal after your baby is born.  Your breasts will continue to grow and be tender. A yellow discharge may leak from your breasts called colostrum.  Your belly button may stick out.  You may feel short of breath because of your expanding uterus.  You may notice the fetus "dropping," or moving lower in your abdomen.  You may have a bloody mucus discharge. This usually occurs a few days to a week before labor begins.  Your cervix becomes thin and soft (effaced) near your due date. WHAT TO EXPECT AT YOUR PRENATAL  EXAMS  You will have prenatal exams every 2 weeks until week 36. Then, you will have weekly prenatal exams. During a routine prenatal visit:  You will be weighed to make sure you and the fetus are growing normally.  Your blood pressure is taken.  Your abdomen will be measured to track your baby's growth.  The fetal heartbeat will be listened to.  Any test results from the previous visit will be discussed.  You may have a cervical check near your due date to see if you have effaced. At around 36 weeks, your caregiver will check your cervix. At the same time, your caregiver will also perform a test on the secretions of the vaginal tissue. This test is to determine if a type of bacteria, Group B streptococcus, is present. Your caregiver will explain this further. Your caregiver may ask you:  What your birth plan is.  How you are feeling.  If you are feeling the baby move.  If you have had any abnormal symptoms, such as leaking fluid, bleeding, severe headaches, or abdominal cramping.  If you have any questions. Other tests or screenings that may be performed during your third trimester include:  Blood tests that check for low iron levels (anemia).  Fetal testing to check the health, activity level, and growth of the fetus. Testing is done if you have certain medical conditions or if there are problems during the pregnancy. FALSE LABOR You may feel small, irregular contractions that   eventually go away. These are called Braxton Hicks contractions, or false labor. Contractions may last for hours, days, or even weeks before true labor sets in. If contractions come at regular intervals, intensify, or become painful, it is best to be seen by your caregiver.  SIGNS OF LABOR   Menstrual-like cramps.  Contractions that are 5 minutes apart or less.  Contractions that start on the top of the uterus and spread down to the lower abdomen and back.  A sense of increased pelvic pressure or back  pain.  A watery or bloody mucus discharge that comes from the vagina. If you have any of these signs before the 37th week of pregnancy, call your caregiver right away. You need to go to the hospital to get checked immediately. HOME CARE INSTRUCTIONS   Avoid all smoking, herbs, alcohol, and unprescribed drugs. These chemicals affect the formation and growth of the baby.  Follow your caregiver's instructions regarding medicine use. There are medicines that are either safe or unsafe to take during pregnancy.  Exercise only as directed by your caregiver. Experiencing uterine cramps is a good sign to stop exercising.  Continue to eat regular, healthy meals.  Wear a good support bra for breast tenderness.  Do not use hot tubs, steam rooms, or saunas.  Wear your seat belt at all times when driving.  Avoid raw meat, uncooked cheese, cat litter boxes, and soil used by cats. These carry germs that can cause birth defects in the baby.  Take your prenatal vitamins.  Try taking a stool softener (if your caregiver approves) if you develop constipation. Eat more high-fiber foods, such as fresh vegetables or fruit and whole grains. Drink plenty of fluids to keep your urine clear or pale yellow.  Take warm sitz baths to soothe any pain or discomfort caused by hemorrhoids. Use hemorrhoid cream if your caregiver approves.  If you develop varicose veins, wear support hose. Elevate your feet for 15 minutes, 3-4 times a day. Limit salt in your diet.  Avoid heavy lifting, wear low heal shoes, and practice good posture.  Rest a lot with your legs elevated if you have leg cramps or low back pain.  Visit your dentist if you have not gone during your pregnancy. Use a soft toothbrush to brush your teeth and be gentle when you floss.  A sexual relationship may be continued unless your caregiver directs you otherwise.  Do not travel far distances unless it is absolutely necessary and only with the approval  of your caregiver.  Take prenatal classes to understand, practice, and ask questions about the labor and delivery.  Make a trial run to the hospital.  Pack your hospital bag.  Prepare the baby's nursery.  Continue to go to all your prenatal visits as directed by your caregiver. SEEK MEDICAL CARE IF:  You are unsure if you are in labor or if your water has broken.  You have dizziness.  You have mild pelvic cramps, pelvic pressure, or nagging pain in your abdominal area.  You have persistent nausea, vomiting, or diarrhea.  You have a bad smelling vaginal discharge.  You have pain with urination. SEEK IMMEDIATE MEDICAL CARE IF:   You have a fever.  You are leaking fluid from your vagina.  You have spotting or bleeding from your vagina.  You have severe abdominal cramping or pain.  You have rapid weight loss or gain.  You have shortness of breath with chest pain.  You notice sudden or extreme swelling   of your face, hands, ankles, feet, or legs.  You have not felt your baby move in over an hour.  You have severe headaches that do not go away with medicine.  You have vision changes. Document Released: 10/01/2001 Document Revised: 10/12/2013 Document Reviewed: 12/08/2012 Exit[employer] Patient Information 2015 [employer]. This information is not intended to replace advice given to you by your health care provider. Make sure you discuss any questions you have with your health care provider.  

## 2014-11-17 ENCOUNTER — Encounter (HOSPITAL_COMMUNITY): Payer: Self-pay | Admitting: *Deleted

## 2014-11-17 ENCOUNTER — Inpatient Hospital Stay (HOSPITAL_COMMUNITY)
Admission: AD | Admit: 2014-11-17 | Discharge: 2014-11-17 | Disposition: A | Discharge status: Home / Self Care | Payer: Medicaid Other | Source: Ambulatory Visit | Attending: Obstetrics & Gynecology | Admitting: Obstetrics & Gynecology

## 2014-11-17 ENCOUNTER — Ambulatory Visit (INDEPENDENT_AMBULATORY_CARE_PROVIDER_SITE_OTHER): Payer: Medicaid Other | Admitting: Obstetrics & Gynecology

## 2014-11-17 VITALS — BP 143/85 | Wt 242.0 lb

## 2014-11-17 DIAGNOSIS — R51 Headache: Secondary | ICD-10-CM | POA: Insufficient documentation

## 2014-11-17 DIAGNOSIS — O163 Unspecified maternal hypertension, third trimester: Secondary | ICD-10-CM

## 2014-11-17 DIAGNOSIS — O133 Gestational [pregnancy-induced] hypertension without significant proteinuria, third trimester: Secondary | ICD-10-CM

## 2014-11-17 DIAGNOSIS — O9989 Other specified diseases and conditions complicating pregnancy, childbirth and the puerperium: Secondary | ICD-10-CM | POA: Insufficient documentation

## 2014-11-17 DIAGNOSIS — Z3A36 36 weeks gestation of pregnancy: Secondary | ICD-10-CM | POA: Diagnosis not present

## 2014-11-17 DIAGNOSIS — O26893 Other specified pregnancy related conditions, third trimester: Secondary | ICD-10-CM

## 2014-11-17 DIAGNOSIS — Z87891 Personal history of nicotine dependence: Secondary | ICD-10-CM | POA: Insufficient documentation

## 2014-11-17 DIAGNOSIS — R030 Elevated blood-pressure reading, without diagnosis of hypertension: Secondary | ICD-10-CM | POA: Diagnosis present

## 2014-11-17 LAB — URINALYSIS, ROUTINE W REFLEX MICROSCOPIC
Bilirubin Urine: NEGATIVE
GLUCOSE, UA: NEGATIVE mg/dL
Hgb urine dipstick: NEGATIVE
Ketones, ur: NEGATIVE mg/dL
LEUKOCYTES UA: NEGATIVE
Nitrite: NEGATIVE
Protein, ur: NEGATIVE mg/dL
Specific Gravity, Urine: 1.01 (ref 1.005–1.030)
UROBILINOGEN UA: 0.2 mg/dL (ref 0.0–1.0)
pH: 6 (ref 5.0–8.0)

## 2014-11-17 LAB — CBC
HCT: 36 % (ref 36.0–46.0)
Hemoglobin: 11.7 g/dL — ABNORMAL LOW (ref 12.0–15.0)
MCH: 28.8 pg (ref 26.0–34.0)
MCHC: 32.5 g/dL (ref 30.0–36.0)
MCV: 88.7 fL (ref 78.0–100.0)
Platelets: 149 10*3/uL — ABNORMAL LOW (ref 150–400)
RBC: 4.06 MIL/uL (ref 3.87–5.11)
RDW: 15 % (ref 11.5–15.5)
WBC: 11.1 10*3/uL — AB (ref 4.0–10.5)

## 2014-11-17 LAB — COMPREHENSIVE METABOLIC PANEL
ALK PHOS: 96 U/L (ref 39–117)
ALT: 22 U/L (ref 0–35)
AST: 20 U/L (ref 0–37)
Albumin: 3 g/dL — ABNORMAL LOW (ref 3.5–5.2)
Anion gap: 7 (ref 5–15)
BUN: 8 mg/dL (ref 6–23)
CO2: 21 mmol/L (ref 19–32)
CREATININE: 0.41 mg/dL — AB (ref 0.50–1.10)
Calcium: 8.9 mg/dL (ref 8.4–10.5)
Chloride: 107 mmol/L (ref 96–112)
GFR calc Af Amer: >90 mL/min (ref >90)
GFR calc non Af Amer: >90 mL/min (ref >90)
Glucose, Bld: 110 mg/dL — ABNORMAL HIGH (ref 70–99)
POTASSIUM: 4.2 mmol/L (ref 3.5–5.1)
Sodium: 135 mmol/L (ref 135–145)
Total Bilirubin: 0.3 mg/dL (ref 0.3–1.2)
Total Protein: 7 g/dL (ref 6.0–8.3)

## 2014-11-17 LAB — PROTEIN / CREATININE RATIO, URINE: Creatinine, Urine: 27 mg/dL

## 2014-11-17 MED ORDER — BUTALBITAL-APAP-CAFFEINE 50-325-40 MG PO TABS
2.0000 | ORAL_TABLET | Freq: Once | ORAL | Status: AC
  Administered 2014-11-17: 2 via ORAL
  Filled 2014-11-17: qty 2

## 2014-11-17 NOTE — MAU Provider Note (Signed)
Chief Complaint:  Hypertension   First Provider Initiated Contact with Patient 11/17/14 1759      HPI: Jaime Morrow is a 39 y.o. [id number] at 1w1dwho presents to maternity admissions sent from the office for elevated BP and h/a.  She reports she had h/a at home last night and took BP at home which was 180s/150s.  She took serial BPs and her pressures came down to 140s/90s. She was seen in the office today and continues to have h/a, and BPs >140/90 so was sent for evaluation.  She reports good fetal movement, denies LOF, vaginal bleeding, vaginal itching/burning, urinary symptoms, dizziness, n/v, or fever/chills.     Past Medical History: Past Medical History  Diagnosis Date  . Anemia   . Asthma   . Blood transfusion abn reaction or complication, no procedure mishap   . 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 Gestational diabetes     insulin and metformin  . Preexisting diabetes complicating pregnancy, antepartum 11/30/2012  . Ruptured lumbar disc     Past obstetric history: OB History  Gravida Para Term Preterm AB SAB TAB Ectopic Multiple Living  _0 # Outcome Date GA Lbr Len/2nd Weight Sex Delivery Anes PTL Lv  12 Current           11 Term 06/10/13 456w1d1:25 5.089 kg (11 lb 3.5 oz) M Vag-Spont None  Y     Comments: faciel bruising  10 Term 02/26/12 3937w0d:15 / 00:15 3.34 kg (7 lb 5.8 oz) M Vag-Vacuum EPI  Y  9 Term 10/16/10 39w58w2d43 kg (7 lb 9 oz) F Vag-Spont EPI N Y  8 Term 03/16/09 37w144w1d57 kg (8 lb 1 oz) M Vag-Spont EPI N Y     Comments: Pre eclampsia  7 Term 10/17/07 3w0d29w0d2 kg (8 lb 4 oz) F Vag-Spont EPI N Y  6 Term 06/16/05 [redacted]w[redacted]d 42w0dg (7 lb 15 oz) F Vag-Spont EPI N Y     Comments: Gestational diabetes  5 SAB 05/16/04             Comments: D&C @ 10 wks/ Blood transfusion  4 SAB 02/15/04             Comments: D & C  @ 13 wks  3 Term 11/16/02 [redacted]w[redacted]d  [redacted]w[redacted]d-Spont EPI N Y  2 Term 04/16/00 [redacted]w[redacted]d  379w0dg (8 lb  2 oz) F Vag-Spont EPI N Y  1 Term 08/16/98 [redacted]w[redacted]d  3.[redacted]w[redacted]d (8 lb 2 oz) M Vag-Spont EPI N Y     Comments: Dry birth      Past Surgical History: Past Surgical History  Procedure Laterality Date  . Dilation and curettage of uterus  2005 x 2  . Lumbar laminectomy  2007  . Lumbar disc surgery      Family History: Family History  Problem Relation Age of Onset  . COPD Maternal Grandmother   . Diabetes type II Maternal Grandmother   . Rheum arthritis Maternal Grandmother   . Cancer Maternal Grandfather     colon  . Alcohol abuse Maternal Grandfather   . Alcohol abuse Paternal Grandfather     Social History: History  Substance Use Topics  . Smoking status: Former Smoker -- 3.00 packs/day for 5 years  . Smokeless tobacco: Never Used  . Alcohol Use: No    Allergies: No Known Allergies  Meds:  Prescriptions prior to admission  Medication Sig Dispense Refill Last Dose  . albuterol (PROVENTIL HFA;VENTOLIN HFA) 108 (90 BASE) MCG/ACT inhaler Inhale 2 puffs into the lungs every 6 (six) hours as needed. For wheezing or shortness of breath   11/17/2014 at Unknown time  . cyclobenzaprine (FLEXERIL) 10 MG tablet Take 1 tablet (10 mg total) by mouth at bedtime. (Patient taking differently: Take 10 mg by mouth at bedtime as needed for muscle spasms. ) 30 tablet 0 Past Week at Unknown time  . diphenhydrAMINE (BENADRYL) 25 MG tablet Take 25 mg by mouth as needed for allergies.    11/16/2014 at Unknown time  . insulin NPH Human (HUMULIN N,NOVOLIN N) 100 UNIT/ML injection Take 46 units in the morning and 32 units at night (Patient taking differently: Inject 40-52 Units into the skin See admin instructions. Take 52 units in the morning and 40 units at night) 10 mL 3 11/17/2014 at Unknown time  . insulin regular (HUMULIN R) 100 units/mL injection Take 26 units before breakfast and 29 units before dinner (Patient taking differently: Inject 12-36 Units into the skin See admin instructions. Take 32 units  before breakfast 12 units at lunch and 36 units before dinner) 10 mL 2 11/17/2014 at Unknown time  . metFORMIN (GLUCOPHAGE) 1000 MG tablet Take 1 tablet (1,000 mg total) by mouth 2 (two) times daily with a meal. 60 tablet 6 11/17/2014 at Unknown time  . Prenatal Vit-Fe Fumarate-FA (MULTIVITAMIN-PRENATAL) 27-0.8 MG TABS Take 1 tablet by mouth daily at 12 noon.   11/16/2014 at Unknown time  . ACCU-CHEK FASTCLIX LANCETS MISC 1 Device by Percutaneous route 4 (four) times daily. 100 each 12 Taking  . fluconazole (DIFLUCAN) 150 MG tablet Take 1 tablet (150 mg total) by mouth once. (Patient not taking: Reported on 11/17/2014) 1 tablet 3 Taking  . glucose blood test strip Use QID 100 each 12 Taking  . glucose monitoring kit (FREESTYLE) monitoring kit 1 each by Does not apply route as needed for other. 1 each 0 Taking  . Insulin Syringe-Needle U-100 30G 1 ML MISC 1 Syringe by Does not apply route 3 (three) times daily. 90 each 3 Taking  . terconazole (TERAZOL 7) 0.4 % vaginal cream Place 1 applicator vaginally at bedtime. x 7 nights (Patient not taking: Reported on 11/17/2014) 45 g 0 Taking    ROS: Pertinent findings in history of present illness.  Physical Exam  Blood pressure 145/85, pulse 103, temperature 97.7 F (36.5 C), temperature source Oral, resp. rate 20, height 5' 4" (1.626 m), weight 109.43 kg (241 lb 4 oz), last menstrual period 03/09/2014, SpO2 100 %.   Patient Vitals for the past 24 hrs:  BP Temp Temp src Pulse Resp SpO2 Height Weight  11/17/14 1800 145/85 mmHg - - 103 - - - -  11/17/14 1745 137/100 mmHg - - 91 - - - -  11/17/14 1730 136/75 mmHg - - 94 - - - -  11/17/14 1715 152/73 mmHg - - 96 - - - -  11/17/14 1713 145/74 mmHg - - 90 - - - -  11/17/14 1647 156/77 mmHg 97.7 F (36.5 C) Oral 98 20 100 % 5' 4" (1.626 m) 109.43 kg (241 lb 4 oz)   GENERAL: Well-developed, well-nourished female in no acute distress.  HEENT: normocephalic HEART: normal rate RESP: normal effort ABDOMEN:  Soft, non-tender, gravid appropriate for gestational age EXTREMITIES: Nontender, no edema NEURO: alert and oriented     FHT:  Baseline 125, moderate  variability, accelerations present, no decelerations Contractions: None on toco or to palpation   Labs: Results for orders placed or performed during the hospital encounter of 11/17/14 (from the past 24 hour(s))  Urinalysis, Routine w reflex microscopic     Status: None   Collection Time: 11/17/14  4:50 PM  Result Value Ref Range   Color, Urine YELLOW YELLOW   APPearance CLEAR CLEAR   Specific Gravity, Urine 1.010 1.005 - 1.030   pH 6.0 5.0 - 8.0   Glucose, UA NEGATIVE NEGATIVE mg/dL   Hgb urine dipstick NEGATIVE NEGATIVE   Bilirubin Urine NEGATIVE NEGATIVE   Ketones, ur NEGATIVE NEGATIVE mg/dL   Protein, ur NEGATIVE NEGATIVE mg/dL   Urobilinogen, UA 0.2 0.0 - 1.0 mg/dL   Nitrite NEGATIVE NEGATIVE   Leukocytes, UA NEGATIVE NEGATIVE  Protein / creatinine ratio, urine     Status: None   Collection Time: 11/17/14  4:50 PM  Result Value Ref Range   Creatinine, Urine 27.00 mg/dL   Total Protein, Urine <6 mg/dL   Protein Creatinine Ratio        0.00 - 0.15  CBC     Status: Abnormal   Collection Time: 11/17/14  5:45 PM  Result Value Ref Range   WBC 11.1 (H) 4.0 - 10.5 K/uL   RBC 4.06 3.87 - 5.11 MIL/uL   Hemoglobin 11.7 (L) 12.0 - 15.0 g/dL   HCT 36.0 36.0 - 46.0 %   MCV 88.7 78.0 - 100.0 fL   MCH 28.8 26.0 - 34.0 pg   MCHC 32.5 30.0 - 36.0 g/dL   RDW 15.0 11.5 - 15.5 %   Platelets 149 (L) 150 - 400 K/uL  Comprehensive metabolic panel     Status: Abnormal   Collection Time: 11/17/14  5:45 PM  Result Value Ref Range   Sodium 135 135 - 145 mmol/L   Potassium 4.2 3.5 - 5.1 mmol/L   Chloride 107 96 - 112 mmol/L   CO2 21 19 - 32 mmol/L   Glucose, Bld 110 (H) 70 - 99 mg/dL   BUN 8 6 - 23 mg/dL   Creatinine, Ser 0.41 (L) 0.50 - 1.10 mg/dL   Calcium 8.9 8.4 - 10.5 mg/dL   Total Protein 7.0 6.0 - 8.3 g/dL   Albumin 3.0 (L) 3.5 -  5.2 g/dL   AST 20 0 - 37 U/L   ALT 22 0 - 35 U/L   Alkaline Phosphatase 96 39 - 117 U/L   Total Bilirubin 0.3 0.3 - 1.2 mg/dL   GFR calc non Af Amer >90 >90 mL/min   GFR calc Af Amer >90 >90 mL/min   Anion gap 7 5 - 15     Assessment: 1. Elevated blood pressure affecting pregnancy in third trimester, antepartum   2. Headache in pregnancy, antepartum, third trimester     Plan: Fioricet x 2 tabs given in MAU with some improvement of h/a Consult Dr Harolyn Rutherford Discharge home with preeclampsia precautions Labor precautions and fetal kick counts F/U on Monday for BP check in office      Follow-up Information    Follow up with [facility].   Specialty:  Obstetrics and Gynecology   Why:  On Monday for BP check   Contact information:   [place], [place] [phone]      Follow up with [facility].   Why:  As needed for emergencies   Contact information:  [address] [id number] [place] [phone]       Medication List    STOP taking these medications        fluconazole 150 MG tablet  Commonly known as:  DIFLUCAN     terconazole 0.4 % vaginal cream  Commonly known as:  TERAZOL 7      TAKE these medications        ACCU-CHEK FASTCLIX LANCETS Misc  1 Device by Percutaneous route 4 (four) times daily.     albuterol 108 (90 BASE) MCG/ACT inhaler  Commonly known as:  PROVENTIL HFA;VENTOLIN HFA  Inhale 2 puffs into the lungs every 6 (six) hours as needed. For wheezing or shortness of breath     cyclobenzaprine 10 MG tablet  Commonly known as:  FLEXERIL  Take 1 tablet (10 mg total) by mouth at bedtime.     diphenhydrAMINE 25 MG tablet  Commonly known as:  BENADRYL  Take 25 mg by mouth as needed for allergies.     glucose blood test strip  Use QID     glucose monitoring kit monitoring kit  1 each by  Does not apply route as needed for other.     insulin NPH Human 100 UNIT/ML injection  Commonly known as:  HUMULIN N,NOVOLIN N  Take 46 units in the morning and 32 units at night     insulin regular 100 units/mL injection  Commonly known as:  HUMULIN R  Take 26 units before breakfast and 29 units before dinner     Insulin Syringe-Needle U-100 30G 1 ML Misc  1 Syringe by Does not apply route 3 (three) times daily.     metFORMIN 1000 MG tablet  Commonly known as:  GLUCOPHAGE  Take 1 tablet (1,000 mg total) by mouth 2 (two) times daily with a meal.     multivitamin-prenatal 27-0.8 MG Tabs tablet  Take 1 tablet by mouth daily at 12 noon.        Fatima Blank Certified Nurse-Midwife 11/17/2014 7:55 PM

## 2014-11-17 NOTE — Discharge Instructions (Signed)

## 2014-11-17 NOTE — MAU Note (Signed)
Pt sent from office for elevated b/p 144/90.  C/O headche today.

## 2014-11-17 NOTE — Progress Notes (Signed)
Pt here because she checked BP at home with reading of 184/154

## 2014-11-17 NOTE — Progress Notes (Signed)
Elevated BPs since 11/03/14.  Pt has severe headache.  Took BP at home and it was 184/154.  Took several in a row and all were >140/90.  [facility]  Headache is still present.  Reactive NST today.

## 2014-11-20 ENCOUNTER — Encounter: Payer: Self-pay | Admitting: Advanced Practice Midwife

## 2014-11-20 ENCOUNTER — Encounter (HOSPITAL_COMMUNITY): Payer: Self-pay | Admitting: *Deleted

## 2014-11-20 ENCOUNTER — Inpatient Hospital Stay (HOSPITAL_COMMUNITY)
Admission: AD | Admit: 2014-11-20 | Discharge: 2014-11-24 | DRG: 774 | Disposition: A | Discharge status: Home / Self Care | Payer: Medicaid Other | Source: Ambulatory Visit | Attending: Family Medicine | Admitting: Family Medicine

## 2014-11-20 DIAGNOSIS — IMO0001 Reserved for inherently not codable concepts without codable children: Secondary | ICD-10-CM

## 2014-11-20 DIAGNOSIS — O24313 Unspecified pre-existing diabetes mellitus in pregnancy, third trimester: Secondary | ICD-10-CM

## 2014-11-20 DIAGNOSIS — Z87891 Personal history of nicotine dependence: Secondary | ICD-10-CM

## 2014-11-20 DIAGNOSIS — R0602 Shortness of breath: Secondary | ICD-10-CM

## 2014-11-20 DIAGNOSIS — E118 Type 2 diabetes mellitus with unspecified complications: Secondary | ICD-10-CM | POA: Diagnosis present

## 2014-11-20 DIAGNOSIS — Z794 Long term (current) use of insulin: Secondary | ICD-10-CM

## 2014-11-20 DIAGNOSIS — O9952 Diseases of the respiratory system complicating childbirth: Principal | ICD-10-CM | POA: Diagnosis present

## 2014-11-20 DIAGNOSIS — O24414 Gestational diabetes mellitus in pregnancy, insulin controlled: Secondary | ICD-10-CM

## 2014-11-20 DIAGNOSIS — O09523 Supervision of elderly multigravida, third trimester: Secondary | ICD-10-CM

## 2014-11-20 DIAGNOSIS — O1490 Unspecified pre-eclampsia, unspecified trimester: Secondary | ICD-10-CM | POA: Diagnosis present

## 2014-11-20 DIAGNOSIS — IMO0002 Reserved for concepts with insufficient information to code with codable children: Secondary | ICD-10-CM | POA: Insufficient documentation

## 2014-11-20 DIAGNOSIS — O2412 Pre-existing diabetes mellitus, type 2, in childbirth: Secondary | ICD-10-CM | POA: Diagnosis present

## 2014-11-20 DIAGNOSIS — O24919 Unspecified diabetes mellitus in pregnancy, unspecified trimester: Secondary | ICD-10-CM

## 2014-11-20 DIAGNOSIS — Z3A36 36 weeks gestation of pregnancy: Secondary | ICD-10-CM | POA: Diagnosis present

## 2014-11-20 DIAGNOSIS — O133 Gestational [pregnancy-induced] hypertension without significant proteinuria, third trimester: Secondary | ICD-10-CM | POA: Diagnosis present

## 2014-11-20 DIAGNOSIS — J45901 Unspecified asthma with (acute) exacerbation: Secondary | ICD-10-CM | POA: Diagnosis present

## 2014-11-20 DIAGNOSIS — O09529 Supervision of elderly multigravida, unspecified trimester: Secondary | ICD-10-CM

## 2014-11-20 HISTORY — DX: Type 2 diabetes mellitus without complications: E11.9

## 2014-11-21 ENCOUNTER — Encounter: Payer: Medicaid Other | Admitting: Advanced Practice Midwife

## 2014-11-21 ENCOUNTER — Encounter (HOSPITAL_COMMUNITY): Payer: Self-pay | Admitting: *Deleted

## 2014-11-21 ENCOUNTER — Encounter (HOSPITAL_COMMUNITY): Payer: Self-pay | Admitting: Certified Registered Nurse Anesthetist

## 2014-11-21 ENCOUNTER — Inpatient Hospital Stay (HOSPITAL_COMMUNITY): Payer: Medicaid Other | Admitting: Anesthesiology

## 2014-11-21 ENCOUNTER — Inpatient Hospital Stay (HOSPITAL_COMMUNITY): Payer: Medicaid Other

## 2014-11-21 DIAGNOSIS — J45901 Unspecified asthma with (acute) exacerbation: Secondary | ICD-10-CM | POA: Diagnosis present

## 2014-11-21 DIAGNOSIS — O2412 Pre-existing diabetes mellitus, type 2, in childbirth: Secondary | ICD-10-CM | POA: Diagnosis present

## 2014-11-21 DIAGNOSIS — Z87891 Personal history of nicotine dependence: Secondary | ICD-10-CM | POA: Diagnosis not present

## 2014-11-21 DIAGNOSIS — O1490 Unspecified pre-eclampsia, unspecified trimester: Secondary | ICD-10-CM | POA: Diagnosis present

## 2014-11-21 DIAGNOSIS — O133 Gestational [pregnancy-induced] hypertension without significant proteinuria, third trimester: Secondary | ICD-10-CM | POA: Diagnosis present

## 2014-11-21 DIAGNOSIS — Z3A36 36 weeks gestation of pregnancy: Secondary | ICD-10-CM | POA: Diagnosis present

## 2014-11-21 DIAGNOSIS — E118 Type 2 diabetes mellitus with unspecified complications: Secondary | ICD-10-CM | POA: Diagnosis present

## 2014-11-21 DIAGNOSIS — O99513 Diseases of the respiratory system complicating pregnancy, third trimester: Secondary | ICD-10-CM | POA: Diagnosis present

## 2014-11-21 DIAGNOSIS — O9952 Diseases of the respiratory system complicating childbirth: Secondary | ICD-10-CM | POA: Diagnosis present

## 2014-11-21 DIAGNOSIS — O09523 Supervision of elderly multigravida, third trimester: Secondary | ICD-10-CM | POA: Diagnosis not present

## 2014-11-21 DIAGNOSIS — Z794 Long term (current) use of insulin: Secondary | ICD-10-CM | POA: Diagnosis not present

## 2014-11-21 DIAGNOSIS — O24919 Unspecified diabetes mellitus in pregnancy, unspecified trimester: Secondary | ICD-10-CM

## 2014-11-21 LAB — ABO/RH: ABO/RH(D): A POS

## 2014-11-21 LAB — CBC WITH DIFFERENTIAL/PLATELET
Basophils Absolute: 0 10*3/uL (ref 0.0–0.1)
Basophils Relative: 0 % (ref 0–1)
EOS PCT: 4 % (ref 0–5)
Eosinophils Absolute: 0.4 10*3/uL (ref 0.0–0.7)
HEMATOCRIT: 33.7 % — AB (ref 36.0–46.0)
HEMOGLOBIN: 10.9 g/dL — AB (ref 12.0–15.0)
LYMPHS ABS: 1.4 10*3/uL (ref 0.7–4.0)
Lymphocytes Relative: 13 % (ref 12–46)
MCH: 28.5 pg (ref 26.0–34.0)
MCHC: 32.3 g/dL (ref 30.0–36.0)
MCV: 88 fL (ref 78.0–100.0)
Monocytes Absolute: 0.7 10*3/uL (ref 0.1–1.0)
Monocytes Relative: 6 % (ref 3–12)
NEUTROS ABS: 8 10*3/uL — AB (ref 1.7–7.7)
NEUTROS PCT: 77 % (ref 43–77)
Platelets: 148 10*3/uL — ABNORMAL LOW (ref 150–400)
RBC: 3.83 MIL/uL — ABNORMAL LOW (ref 3.87–5.11)
RDW: 15.1 % (ref 11.5–15.5)
WBC: 10.4 10*3/uL (ref 4.0–10.5)

## 2014-11-21 LAB — COMPREHENSIVE METABOLIC PANEL
ALBUMIN: 2.9 g/dL — AB (ref 3.5–5.2)
ALK PHOS: 90 U/L (ref 39–117)
ALT: 21 U/L (ref 0–35)
AST: 19 U/L (ref 0–37)
Anion gap: 4 — ABNORMAL LOW (ref 5–15)
BUN: 9 mg/dL (ref 6–23)
CALCIUM: 8.4 mg/dL (ref 8.4–10.5)
CO2: 22 mmol/L (ref 19–32)
CREATININE: 0.43 mg/dL — AB (ref 0.50–1.10)
Chloride: 110 mmol/L (ref 96–112)
GFR calc Af Amer: >90 mL/min (ref >90)
GFR calc non Af Amer: >90 mL/min (ref >90)
GLUCOSE: 153 mg/dL — AB (ref 70–99)
Potassium: 3.5 mmol/L (ref 3.5–5.1)
Sodium: 136 mmol/L (ref 135–145)
Total Bilirubin: 0.3 mg/dL (ref 0.3–1.2)
Total Protein: 6.1 g/dL (ref 6.0–8.3)

## 2014-11-21 LAB — URINALYSIS, ROUTINE W REFLEX MICROSCOPIC
Bilirubin Urine: NEGATIVE
Glucose, UA: >1000 mg/dL — AB
HGB URINE DIPSTICK: NEGATIVE
KETONES UR: 15 mg/dL — AB
Nitrite: NEGATIVE
PROTEIN: NEGATIVE mg/dL
Specific Gravity, Urine: 1.02 (ref 1.005–1.030)
UROBILINOGEN UA: 0.2 mg/dL (ref 0.0–1.0)
pH: 6.5 (ref 5.0–8.0)

## 2014-11-21 LAB — PROTEIN / CREATININE RATIO, URINE
Creatinine, Urine: 70 mg/dL
PROTEIN CREATININE RATIO: 0.1 (ref 0.00–0.15)
Total Protein, Urine: 7 mg/dL

## 2014-11-21 LAB — CBC
HEMATOCRIT: 35.3 % — AB (ref 36.0–46.0)
HEMOGLOBIN: 11.3 g/dL — AB (ref 12.0–15.0)
MCH: 28.4 pg (ref 26.0–34.0)
MCHC: 32 g/dL (ref 30.0–36.0)
MCV: 88.7 fL (ref 78.0–100.0)
Platelets: 145 10*3/uL — ABNORMAL LOW (ref 150–400)
RBC: 3.98 MIL/uL (ref 3.87–5.11)
RDW: 15.3 % (ref 11.5–15.5)
WBC: 9.4 10*3/uL (ref 4.0–10.5)

## 2014-11-21 LAB — GLUCOSE, CAPILLARY
GLUCOSE-CAPILLARY: 120 mg/dL — AB (ref 70–99)
GLUCOSE-CAPILLARY: 76 mg/dL (ref 70–99)
GLUCOSE-CAPILLARY: 66 mg/dL — AB (ref 70–99)
Glucose-Capillary: 106 mg/dL — ABNORMAL HIGH (ref 70–99)
Glucose-Capillary: 90 mg/dL (ref 70–99)
Glucose-Capillary: 105 mg/dL — ABNORMAL HIGH (ref 70–99)
Glucose-Capillary: 100 mg/dL — ABNORMAL HIGH (ref 70–99)
Glucose-Capillary: 83 mg/dL (ref 70–99)

## 2014-11-21 LAB — TYPE AND SCREEN
ABO/RH(D): A POS
ANTIBODY SCREEN: NEGATIVE

## 2014-11-21 LAB — URINE MICROSCOPIC-ADD ON

## 2014-11-21 MED ORDER — INSULIN REGULAR HUMAN 100 UNIT/ML IJ SOLN
12.0000 [IU] | Freq: Every day | INTRAMUSCULAR | Status: DC
  Filled 2014-11-21: qty 0.12

## 2014-11-21 MED ORDER — OXYTOCIN 40 UNITS IN LACTATED RINGERS INFUSION - SIMPLE MED: 1.0000 m[IU]/min | INTRAVENOUS | Status: DC

## 2014-11-21 MED ORDER — CALCIUM CARBONATE ANTACID 500 MG PO CHEW: 2.0000 | CHEWABLE_TABLET | ORAL | Status: DC | PRN

## 2014-11-21 MED ORDER — ACETAMINOPHEN 325 MG PO TABS: 650.0000 mg | ORAL_TABLET | ORAL | Status: DC | PRN

## 2014-11-21 MED ORDER — BUTORPHANOL TARTRATE 1 MG/ML IJ SOLN: 1.0000 mg | INTRAMUSCULAR | Status: DC | PRN

## 2014-11-21 MED ORDER — CITRIC ACID-SODIUM CITRATE 334-500 MG/5ML PO SOLN: 30.0000 mL | ORAL | Status: DC | PRN

## 2014-11-21 MED ORDER — DOCUSATE SODIUM 100 MG PO CAPS: 100.0000 mg | ORAL_CAPSULE | Freq: Every day | ORAL | Status: DC

## 2014-11-21 MED ORDER — INSULIN NPH (HUMAN) (ISOPHANE) 100 UNIT/ML ~~LOC~~ SUSP
20.0000 [IU] | Freq: Every day | SUBCUTANEOUS | Status: AC
  Administered 2014-11-21: 20 [IU] via SUBCUTANEOUS
  Filled 2014-11-21: qty 10

## 2014-11-21 MED ORDER — ZOLPIDEM TARTRATE 5 MG PO TABS: 5.0000 mg | ORAL_TABLET | Freq: Every evening | ORAL | Status: DC | PRN

## 2014-11-21 MED ORDER — PRENATAL 27-0.8 MG PO TABS: 1.0000 | ORAL_TABLET | Freq: Every day | ORAL | Status: DC

## 2014-11-21 MED ORDER — INSULIN REGULAR HUMAN 100 UNIT/ML IJ SOLN: 36.0000 [IU] | Freq: Every day | INTRAMUSCULAR | Status: DC

## 2014-11-21 MED ORDER — INSULIN NPH (HUMAN) (ISOPHANE) 100 UNIT/ML ~~LOC~~ SUSP: 40.0000 [IU] | Freq: Every day | SUBCUTANEOUS | Status: DC

## 2014-11-21 MED ORDER — FENTANYL 2.5 MCG/ML BUPIVACAINE 1/10 % EPIDURAL INFUSION (WH - ANES): INTRAMUSCULAR | Status: AC | PRN

## 2014-11-21 MED ORDER — METFORMIN HCL 500 MG PO TABS
1000.0000 mg | ORAL_TABLET | Freq: Two times a day (BID) | ORAL | Status: DC
  Administered 2014-11-21 (×2): 1000 mg via ORAL
  Filled 2014-11-21 (×5): qty 2

## 2014-11-21 MED ORDER — OXYTOCIN 40 UNITS IN LACTATED RINGERS INFUSION - SIMPLE MED: 62.5000 mL/h | INTRAVENOUS | Status: DC

## 2014-11-21 MED ORDER — IPRATROPIUM-ALBUTEROL 0.5-2.5 (3) MG/3ML IN SOLN
3.0000 mL | RESPIRATORY_TRACT | Status: AC
  Administered 2014-11-21: 3 mL via RESPIRATORY_TRACT
  Filled 2014-11-21 (×3): qty 3

## 2014-11-21 MED ORDER — OXYCODONE-ACETAMINOPHEN 5-325 MG PO TABS
2.0000 | ORAL_TABLET | ORAL | Status: DC | PRN
  Administered 2014-11-21 (×2): 1 via ORAL
  Filled 2014-11-21 (×2): qty 2

## 2014-11-21 MED ORDER — LABETALOL HCL 5 MG/ML IV SOLN: 20.0000 mg | INTRAVENOUS | Status: DC | PRN

## 2014-11-21 MED ORDER — PRENATAL MULTIVITAMIN CH: 1.0000 | ORAL_TABLET | Freq: Every day | ORAL | Status: DC

## 2014-11-21 MED ORDER — OXYTOCIN 40 UNITS IN LACTATED RINGERS INFUSION - SIMPLE MED
1.0000 m[IU]/min | INTRAVENOUS | Status: DC
  Administered 2014-11-21: 2 m[IU]/min via INTRAVENOUS
  Administered 2014-11-22: 16 m[IU]/min via INTRAVENOUS
  Administered 2014-11-22: 12 m[IU]/min via INTRAVENOUS
  Administered 2014-11-22: 18 m[IU]/min via INTRAVENOUS
  Administered 2014-11-22: 2 m[IU]/min via INTRAVENOUS
  Filled 2014-11-21: qty 1000

## 2014-11-21 MED ORDER — INSULIN NPH (HUMAN) (ISOPHANE) 100 UNIT/ML ~~LOC~~ SUSP: 20.0000 [IU] | Freq: Every day | SUBCUTANEOUS | Status: DC

## 2014-11-21 MED ORDER — LIDOCAINE HCL (PF) 1 % IJ SOLN: 30.0000 mL | INTRAMUSCULAR | Status: DC | PRN

## 2014-11-21 MED ORDER — OXYCODONE-ACETAMINOPHEN 5-325 MG PO TABS
1.0000 | ORAL_TABLET | ORAL | Status: DC | PRN
Start: 2014-11-21 — End: 2014-11-21
  Administered 2014-11-21 (×2): 1 via ORAL
  Filled 2014-11-21 (×2): qty 1

## 2014-11-21 MED ORDER — OXYTOCIN BOLUS FROM INFUSION: 500.0000 mL | INTRAVENOUS | Status: DC

## 2014-11-21 MED ORDER — HYDRALAZINE HCL 20 MG/ML IJ SOLN
5.0000 mg | INTRAMUSCULAR | Status: DC | PRN
  Administered 2014-11-21: 10 mg via INTRAVENOUS
  Filled 2014-11-21: qty 1

## 2014-11-21 MED ORDER — PHENYLEPHRINE 40 MCG/ML (10ML) SYRINGE FOR IV PUSH (FOR BLOOD PRESSURE SUPPORT)
80.0000 ug | PREFILLED_SYRINGE | INTRAVENOUS | Status: DC | PRN
  Filled 2014-11-21: qty 2

## 2014-11-21 MED ORDER — LIDOCAINE HCL (PF) 1 % IJ SOLN
30.0000 mL | INTRAMUSCULAR | Status: DC | PRN
Start: 2014-11-21 — End: 2014-11-21

## 2014-11-21 MED ORDER — FLEET ENEMA 7-19 GM/118ML RE ENEM: 1.0000 | ENEMA | RECTAL | Status: DC | PRN

## 2014-11-21 MED ORDER — IPRATROPIUM-ALBUTEROL 0.5-2.5 (3) MG/3ML IN SOLN
3.0000 mL | RESPIRATORY_TRACT | Status: DC | PRN
  Administered 2014-11-21 (×2): 3 mL via RESPIRATORY_TRACT
  Filled 2014-11-21 (×3): qty 3

## 2014-11-21 MED ORDER — LACTATED RINGERS IV SOLN: 500.0000 mL | INTRAVENOUS | Status: DC | PRN

## 2014-11-21 MED ORDER — DIPHENHYDRAMINE HCL 25 MG PO TABS
25.0000 mg | ORAL_TABLET | ORAL | Status: DC | PRN
  Filled 2014-11-21: qty 1

## 2014-11-21 MED ORDER — OXYCODONE-ACETAMINOPHEN 5-325 MG PO TABS: 2.0000 | ORAL_TABLET | ORAL | Status: DC | PRN

## 2014-11-21 MED ORDER — PHENYLEPHRINE 40 MCG/ML (10ML) SYRINGE FOR IV PUSH (FOR BLOOD PRESSURE SUPPORT)
PREFILLED_SYRINGE | INTRAVENOUS | Status: AC
  Filled 2014-11-21: qty 20

## 2014-11-21 MED ORDER — OXYTOCIN 40 UNITS IN LACTATED RINGERS INFUSION - SIMPLE MED
INTRAVENOUS | Status: AC
  Administered 2014-11-21: 2 m[IU]/min via INTRAVENOUS
  Filled 2014-11-21: qty 1000

## 2014-11-21 MED ORDER — LACTATED RINGERS IV SOLN: INTRAVENOUS | Status: DC

## 2014-11-21 MED ORDER — FENTANYL 2.5 MCG/ML BUPIVACAINE 1/10 % EPIDURAL INFUSION (WH - ANES)
14.0000 mL/h | INTRAMUSCULAR | Status: DC | PRN
  Administered 2014-11-21 – 2014-11-22 (×5): 14 mL/h via EPIDURAL
  Filled 2014-11-21 (×4): qty 125

## 2014-11-21 MED ORDER — ALBUTEROL SULFATE (2.5 MG/3ML) 0.083% IN NEBU
3.0000 mL | INHALATION_SOLUTION | Freq: Four times a day (QID) | RESPIRATORY_TRACT | Status: DC | PRN
  Administered 2014-11-21 (×2): 3 mL via RESPIRATORY_TRACT
  Filled 2014-11-21 (×2): qty 3

## 2014-11-21 MED ORDER — ONDANSETRON HCL 4 MG/2ML IJ SOLN: 4.0000 mg | Freq: Four times a day (QID) | INTRAMUSCULAR | Status: DC | PRN

## 2014-11-21 MED ORDER — OXYCODONE-ACETAMINOPHEN 5-325 MG PO TABS
1.0000 | ORAL_TABLET | ORAL | Status: DC | PRN
Start: 2014-11-21 — End: 2014-11-21

## 2014-11-21 MED ORDER — ALBUTEROL (5 MG/ML) CONTINUOUS INHALATION SOLN: 5.0000 mg/h | INHALATION_SOLUTION | RESPIRATORY_TRACT | Status: DC

## 2014-11-21 MED ORDER — LACTATED RINGERS IV SOLN
INTRAVENOUS | Status: DC
  Administered 2014-11-21 – 2014-11-22 (×3): via INTRAVENOUS

## 2014-11-21 MED ORDER — METHYLPREDNISOLONE SODIUM SUCC 40 MG IJ SOLR
40.0000 mg | Freq: Two times a day (BID) | INTRAMUSCULAR | Status: AC
  Administered 2014-11-21 (×2): 40 mg via INTRAVENOUS
  Filled 2014-11-21 (×2): qty 1

## 2014-11-21 MED ORDER — EPHEDRINE 5 MG/ML INJ
10.0000 mg | INTRAVENOUS | Status: DC | PRN
  Filled 2014-11-21: qty 2

## 2014-11-21 MED ORDER — FENTANYL 2.5 MCG/ML BUPIVACAINE 1/10 % EPIDURAL INFUSION (WH - ANES)
INTRAMUSCULAR | Status: AC
  Administered 2014-11-21: 14 mL/h via EPIDURAL
  Filled 2014-11-21: qty 125

## 2014-11-21 MED ORDER — INSULIN REGULAR HUMAN 100 UNIT/ML IJ SOLN
32.0000 [IU] | Freq: Every day | INTRAMUSCULAR | Status: DC
  Filled 2014-11-21: qty 0.32

## 2014-11-21 MED ORDER — INSULIN NPH (HUMAN) (ISOPHANE) 100 UNIT/ML ~~LOC~~ SUSP
52.0000 [IU] | Freq: Every day | SUBCUTANEOUS | Status: DC
  Filled 2014-11-21: qty 10

## 2014-11-21 MED ORDER — LIDOCAINE HCL (PF) 1 % IJ SOLN
INTRAMUSCULAR | Status: DC | PRN
  Administered 2014-11-21: 4 mL

## 2014-11-21 MED ORDER — LACTATED RINGERS IV SOLN: 500.0000 mL | Freq: Once | INTRAVENOUS | Status: DC

## 2014-11-21 MED ORDER — OXYCODONE-ACETAMINOPHEN 5-325 MG PO TABS: 1.0000 | ORAL_TABLET | ORAL | Status: DC | PRN

## 2014-11-21 MED ORDER — LEVALBUTEROL HCL 0.63 MG/3ML IN NEBU
0.6300 mg | INHALATION_SOLUTION | Freq: Four times a day (QID) | RESPIRATORY_TRACT | Status: DC | PRN
  Filled 2014-11-21: qty 3

## 2014-11-21 MED ORDER — LIDOCAINE HCL (PF) 1 % IJ SOLN: INTRAMUSCULAR | Status: DC | PRN

## 2014-11-21 MED ORDER — DIPHENHYDRAMINE HCL 50 MG/ML IJ SOLN
12.5000 mg | INTRAMUSCULAR | Status: AC | PRN
  Administered 2014-11-21 (×3): 12.5 mg via INTRAVENOUS
  Filled 2014-11-21 (×2): qty 1

## 2014-11-21 MED ORDER — TERBUTALINE SULFATE 1 MG/ML IJ SOLN: 0.2500 mg | Freq: Once | INTRAMUSCULAR | Status: AC | PRN

## 2014-11-21 MED ORDER — CYCLOBENZAPRINE HCL 10 MG PO TABS
10.0000 mg | ORAL_TABLET | Freq: Every evening | ORAL | Status: DC | PRN
  Administered 2014-11-21: 10 mg via ORAL
  Filled 2014-11-21 (×2): qty 1

## 2014-11-21 MED ORDER — LEVALBUTEROL HCL 0.63 MG/3ML IN NEBU
0.6300 mg | INHALATION_SOLUTION | Freq: Four times a day (QID) | RESPIRATORY_TRACT | Status: DC
  Administered 2014-11-21 – 2014-11-22 (×5): 0.63 mg via RESPIRATORY_TRACT
  Filled 2014-11-21 (×8): qty 3

## 2014-11-21 NOTE — Progress Notes (Signed)
Jaime Morrow is a 39 y.o. [id number] at 9137w5d by LMP admitted for induction of labor due to Hypertension and Pre-eclamptic toxemia of pregnancy..  Subjective: Patient continues to have some wheezing. Much improved since last checked. Reports no significant SOB. CXR obtained. Not yet read by radiology. Appears overdeveloped; no consolidations or pleural effusions present. Possible fluid overloaded, but could be image overexposure.   Objective: BP 94/75 mmHg  Pulse 117  Temp(Src) 97.8 F (36.6 C) (Oral)  Resp 26  Ht 5\' 4"  (1.626 m)  Wt 111.766 kg (246 lb 6.4 oz)  BMI 42.27 kg/m2  SpO2 92%  LMP 03/09/2014 I/O last 3 completed shifts: In: 60 [P.O.:60] Out: 225 [Urine:225] Total I/O In: 240 [P.O.:240] Out: 350 [Urine:350]  General -- oriented x3, pleasant and cooperative. Chest -- good expansion. Expiratory wheezing present in RML and RLL.   Cardiac -- RRR. No murmurs noted.   FHT:  FHR: 160 bpm, variability: moderate,  accelerations:  Present,  decelerations:  Absent UC:   regular, every 7 minutes SVE:   Dilation: 1 Effacement (%): 50 Exam by:: acosta  Labs: Lab Results  Component Value Date   WBC 9.4 11/21/2014   HGB 11.3* 11/21/2014   HCT 35.3* 11/21/2014   MCV 88.7 11/21/2014   PLT 145* 11/21/2014    Assessment / Plan: Induction of labor due to preeclampsia and gestational hypertension,  progressing well on pitocin  Labor: Progressing on Pitocin, will continue to increase then AROM Preeclampsia:  labs stable Fetal Wellbeing:  Category I Pain Control:  Epidural I/D:  GBS neg Anticipated MOD:  NSVD  Kathee DeltonMcKeag, Ian D 11/21/2014, 1:08 PM

## 2014-11-21 NOTE — Anesthesia Preprocedure Evaluation (Signed)
Anesthesia Evaluation  Patient identified by MRN, date of birth, ID band Patient awake    Reviewed: Allergy & Precautions, H&P , Patient's Chart, lab work & pertinent test results  Airway Mallampati: III  TM Distance: >3 FB Neck ROM: full    Dental   Pulmonary asthma , neg recent URI, former smoker,    + wheezing      Cardiovascular hypertension, Rhythm:regular Rate:Normal     Neuro/Psych    GI/Hepatic   Endo/Other  diabetesMorbid obesity  Renal/GU      Musculoskeletal   Abdominal   Peds  Hematology  (+) anemia ,   Anesthesia Other Findings   Reproductive/Obstetrics (+) Pregnancy                             Anesthesia Physical  Anesthesia Plan  ASA: III  Anesthesia Plan:    Post-op Pain Management:    Induction:   Airway Management Planned:   Additional Equipment:   Intra-op Plan:   Post-operative Plan:   Informed Consent: I have reviewed the patients History and Physical, chart, labs and discussed the procedure including the risks, benefits and alternatives for the proposed anesthesia with the patient or authorized representative who has indicated his/her understanding and acceptance.     Plan Discussed with:   Anesthesia Plan Comments:         Anesthesia Quick Evaluation

## 2014-11-21 NOTE — Progress Notes (Signed)
Patient struggling to breath. Expiratory wheezes audible without stethescope.

## 2014-11-21 NOTE — Progress Notes (Signed)
Patient's breathing labored and wheezing returned.

## 2014-11-21 NOTE — Progress Notes (Signed)
Jaime BlueJennifer Morrow is a 39 y.o. [id number] at 6543w5d [facility]  Subjective: Pt is feeling well. C/o itching skin. Having baby girl. Will breastfeed, desires depo postpartum.  Objective: BP 132/56 mmHg  Pulse 103  Temp(Src) 98 F (36.7 C) (Oral)  Resp 20  Ht 5\' 4"  (1.626 m)  Wt 111.766 kg (246 lb 6.4 oz)  BMI 42.27 kg/m2  SpO2 91%  LMP 03/09/2014 I/O last 3 completed shifts: In: 2313.5 [P.O.:1380; I.V.:933.5] Out: 1925 [Urine:1925]    FHT:  FHR: 150 bpm, variability: moderate,  accelerations:  Present,  decelerations:  Present single late UC:   irregular, every 3 minutes SVE:   Dilation: 2.5 Effacement (%): 50 Station: Ballotable Exam by:: McEachron  Labs: Lab Results  Component Value Date   WBC 9.4 11/21/2014   HGB 11.3* 11/21/2014   HCT 35.3* 11/21/2014   MCV 88.7 11/21/2014   PLT 145* 11/21/2014    [facility] / [facility].  Labor: Progressing on Pitocin, will continue to increase then AROM Preeclampsia:  closely monitoring blood pressures. not currently on mag. Fetal Wellbeing:  Category I Pain Control:  Epidural I/D:  GBS neg Anticipated MOD:  NSVD  [name], [place] 11/21/2014, 9:19 PM

## 2014-11-21 NOTE — H&P (Addendum)
Patient is 39 y.o. [id number] 9024w5d by LMP presents for shortness of breath x 1 day with wheezing. Has had headache intermittently since Thursday but today, headache has been consistent. Tylenol doesn't help. Admits to feeling facial swelling. Denies visual changes or abdominal pain.   Pt has PMH significant for asthma and DM 2 with insulin dependence. Ultrasound performed on 11/16/14 showed baby's weight in 90th% at 8 lb 5 oz.  History   Clinic [place]  Dating By LMP  Genetic Screen declines  Anatomic US nml  GTT Pregest DM  TDaP vaccine declines  Flu vaccine  declines  GBS   Contraception   Baby Food  breast  Circumcision girl  Pediatrician  Dr. Newman PiesBall  Support Person  husband   US at 36 wks:   Growth > 90% with AC >97% (accelerated growth) with EFW 3788gm        BPP 8/8  OB History    Gravida Para Term Preterm AB TAB SAB Ectopic Multiple Living   12 9 9  2  2   9      Past Medical History  Diagnosis Date  . Anemia   . Asthma   . Blood transfusion abn reaction or complication, no procedure mishap   . 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 Gestational diabetes     insulin and metformin  . Preexisting diabetes complicating pregnancy, antepartum 11/30/2012  . Ruptured lumbar disc    Past Surgical History  Procedure Laterality Date  . Dilation and curettage of uterus  2005 x 2  . Lumbar laminectomy  2007  . Lumbar disc surgery     Family History: family history includes Alcohol abuse in her maternal grandfather and paternal grandfather; COPD in her maternal grandmother; Cancer in her maternal grandfather; Diabetes type II in her maternal grandmother; Rheum arthritis in her maternal grandmother. Social History:  reports that she has quit smoking. She has never used smokeless tobacco. She reports that she does not drink alcohol or use illicit drugs.    ROS   Negative except for HPI    Blood pressure 156/87, pulse 108, temperature 98.1 F (36.7  C), temperature source Oral, resp. rate 18, height 5\' 4"  (1.626 m), weight 111.766 kg (246 lb 6.4 oz), last menstrual period 03/09/2014, SpO2 95 %. Maternal Exam:  Abdomen: Patient reports no abdominal tenderness.   Fetal Exam Fetal Monitor Review: Baseline rate: 150.  Variability: moderate (6-25 bpm).   Pattern: accelerations present and no decelerations.    Fetal State Assessment: Category I - tracings are normal.    no contractions Physical Exam  Constitutional: She is oriented to person, place, and time. She appears well-developed and well-nourished. No distress.  HENT:  Head: Normocephalic and atraumatic.  Cardiovascular: Normal rate and normal heart sounds.   +1 pitting edema bl   Respiratory:  Wheezing diffusely, lungs CTA s/p breathing tx, no respiratory distress  Neurological: She is alert and oriented to person, place, and time.  Skin: Skin is warm and dry. She is not diaphoretic.  Psychiatric: She has a normal mood and affect. Her behavior is normal.    Prenatal labs: ABO, Rh: A/POS/-- (08/28 1120) Antibody: NEG (08/28 1120) Rubella: 1.86 (08/28 1120) RPR: NON REAC (12/01 1337)  HBsAg: NEGATIVE (08/28 1120)  HIV: NONREACTIVE (12/01 1337)  GBS: Negative (01/26 0000)   Assessment/Plan: Patient is 39 y.o. [id number] 6724w5d reporting wheezing likely secondary to asthma exacerbation. Pt also reports headache and has elevated blood pressure concerning  for preEclampsia.  # headache with elevated blood pressure, concern for preEclampsia - admit for observation to monitor blood pressure - cbc, cmp, pr:cr, 24 hour protein wnl - headache improved with percocet - monitor blood pressure  # acute on chronic asthma exacerbation in 3rd trimester of pregnancy- duonebs treatment - improved with duonebs  # DM 2 with insulin dependence - U/S 11/16/14-> baby's wt 90th% at 8 lb 5 oz - cont home insulin regimen - cbg TID  Rolm Bookbinder 11/21/2014, 2:23 AM   Attestation of  Attending Supervision of Resident: Evaluation and management procedures were performed by the [facility] Medicine Resident under my supervision.  I have seen and examined the patient, reviewed the resident's note and chart, and I agree with the management and plan.  Anibal Henderson, M.D. 12/22/2014 1:35 PM

## 2014-11-21 NOTE — Progress Notes (Signed)
Patient ID: Jaime Morrow, female   DOB: [birth year], 39 y.o.   MRN: [id number] ACULTY PRACTICE ANTEPARTUM COMPREHENSIVE PROGRESS NOTE  Jaime Morrow is a 39 y.o. [id number] at [redacted]w[redacted]d  who is admitted for induction of labor due to severe gestational hypertension..   Fetal presentation is cephalic. Length of Stay:  1  Days  Subjective: Pt reports that she came in due to headache and elevated blood pressures. Patient reports good fetal movement.  She reports no uterine contractions, no bleeding and no loss of fluid per vagina.  Vitals:  Blood pressure 132/88, pulse 112, temperature 97.8 F (36.6 C), temperature source Oral, resp. rate 16, height  (1.626 m), weight 246 lb 6.4 oz (111.766 kg), last menstrual period 03/09/2014, SpO2 97 %. Physical Examination: General appearance - in mild to moderate distress Lungs: exp wheezes; increased work of breathing CV: RRR Abd: gravid; NT, ND Ext: 1-2+ non pitting edema Cervical Exam: Evaluated by digital exam., Position: posterior, Dilation: 1cm, Thickness: 0.5 cm and Consistency: soft Ob effacement:14523}/-3 and fetal presentation is cephalic. Membranes:intact  Fetal Monitoring:  Baseline: 140's bpm, Variability: Good {> 6 bpm), Accelerations: Reactive and Decelerations: Absent  Labs:  Results for orders placed or performed during the hospital encounter of 11/20/14 (from the past 24 hour(s))  Protein / creatinine ratio, urine   Collection Time: 11/20/14 11:20 PM  Result Value Ref Range   Creatinine, Urine 70.00 mg/dL   Total Protein, Urine 7 mg/dL   Protein Creatinine Ratio 0.10 0.00 - 0.15  Urinalysis, Routine w reflex microscopic   Collection Time: 11/20/14 11:20 PM  Result Value Ref Range   Color, Urine YELLOW YELLOW   APPearance CLOUDY (A) CLEAR   Specific Gravity, Urine 1.020 1.005 - 1.030   pH 6.5 5.0 - 8.0   Glucose, UA >1000 (A) NEGATIVE mg/dL   Hgb urine dipstick NEGATIVE NEGATIVE   Bilirubin Urine NEGATIVE NEGATIVE   Ketones, ur 15 (A) NEGATIVE mg/dL   Protein, ur NEGATIVE NEGATIVE mg/dL   Urobilinogen, UA 0.2 0.0 - 1.0 mg/dL   Nitrite NEGATIVE NEGATIVE   Leukocytes, UA SMALL (A) NEGATIVE  Urine microscopic-add on   Collection Time: 11/20/14 11:20 PM  Result Value Ref Range   Squamous Epithelial / LPF MANY (A) RARE   WBC, UA 7-10 <3 WBC/hpf   RBC / HPF 0-2 <3 RBC/hpf   Bacteria, UA FEW (A) RARE  CBC with Differential/Platelet   Collection Time: 11/21/14 12:25 AM  Result Value Ref Range   WBC 10.4 4.0 - 10.5 K/uL   RBC 3.83 (L) 3.87 - 5.11 MIL/uL   Hemoglobin 10.9 (L) 12.0 - 15.0 g/dL   HCT 47.8 (L) 29.5 - 62.1 %   MCV 88.0 78.0 - 100.0 fL   MCH 28.5 26.0 - 34.0 pg   MCHC 32.3 30.0 - 36.0 g/dL   RDW 30.8 65.7 - 84.6 %   Platelets 148 (L) 150 - 400 K/uL   Neutrophils Relative % 77 43 - 77 %   Neutro Abs 8.0 (H) 1.7 - 7.7 K/uL   Lymphocytes Relative 13 12 - 46 %   Lymphs Abs 1.4 0.7 - 4.0 K/uL   Monocytes Relative 6 3 - 12 %   Monocytes Absolute 0.7 0.1 - 1.0 K/uL   Eosinophils Relative 4 0 - 5 %   Eosinophils Absolute 0.4 0.0 - 0.7 K/uL   Basophils Relative 0 0 - 1 %   Basophils Absolute 0.0 0.0 - 0.1 K/uL  Comprehensive metabolic panel   Collection  Time: 11/21/14 12:25 AM  Result Value Ref Range   Sodium 136 135 - 145 mmol/L   Potassium 3.5 3.5 - 5.1 mmol/L   Chloride 110 96 - 112 mmol/L   CO2 22 19 - 32 mmol/L   Glucose, Bld 153 (H) 70 - 99 mg/dL   BUN 9 6 - 23 mg/dL   Creatinine, Ser 1.610.43 (L) 0.50 - 1.10 mg/dL   Calcium 8.4 8.4 - 09.610.5 mg/dL   Total Protein 6.1 6.0 - 8.3 g/dL   Albumin 2.9 (L) 3.5 - 5.2 g/dL   AST 19 0 - 37 U/L   ALT 21 0 - 35 U/L   Alkaline Phosphatase 90 39 - 117 U/L   Total Bilirubin 0.3 0.3 - 1.2 mg/dL   GFR calc non Af Amer >90 >90 mL/min   GFR calc Af Amer >90 >90 mL/min   Anion gap 4 (L) 5 - 15    Imaging Studies:    Last sono 8#6oz   Medications:  Scheduled . docusate sodium  100 mg Oral Daily  . [START ON 11/22/2014] insulin NPH Human  20 Units  Subcutaneous Q breakfast  . metFORMIN  1,000 mg Oral BID WC   I have reviewed the patient's current medications.  ASSESSMENT: Patient Active Problem List   Diagnosis Date Noted  . Gestational hypertension without significant proteinuria in third trimester 11/21/2014  . Indication for care in labor and delivery, antepartum 11/21/2014  . Diabetes in pregnancy 11/21/2014  . Suspected macroscopic fetus 11/20/2014  . Type 2 diabetes mellitus during pregnancy   . Insulin controlled gestational diabetes mellitus in third trimester   . Preexisting diabetes complicating pregnancy, antepartum 06/17/2014  . High-risk pregnancy in multigravida greater than 41 years of age, antepartum 06/17/2014  . Pain in symphysis pubis during pregnancy 06/17/2014  . 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 Grand multipara 02/17/2013  . Obesity complicating pregnancy 02/09/2013  . AMA (advanced maternal age) multigravida 35+ 02/09/2013  . Asthma 09/25/2011    PLAN: Admit to L&D for IOL due to severe gestational hypertension Magnesium sulfate in labor Pitocin  Anticipate SVD  HARRAWAY-SMITH, Parisha Beaulac 11/21/2014,8:11 AM

## 2014-11-21 NOTE — Anesthesia Procedure Notes (Addendum)
Procedures

## 2014-11-21 NOTE — Progress Notes (Signed)
Krista BlueJennifer Saxton is a 39 y.o. [id number] at 1348w5d by LMP admitted for induction of labor due to chronic hypertension with superimposed preeclampsia.   Subjective: Breathing significantly improved. CXR wnl. Comfortable and without complaints. Denies headache, visual changes, RUQ abdominal pain.   Objective: BP 149/56 mmHg  Pulse 114  Temp(Src) 97.8 F (36.6 C) (Oral)  Resp 20  Ht 5\' 4"  (1.626 m)  Wt 246 lb 6.4 oz (111.766 kg)  BMI 42.27 kg/m2  SpO2 91%  LMP 03/09/2014 I/O last 3 completed shifts: In: 60 [P.O.:60] Out: 225 [Urine:225] Total I/O In: 1802 [P.O.:1080; I.V.:722] Out: 1450 [Urine:1450]  General -- oriented x3, pleasant and cooperative. Chest -- good expansion. Expiratory wheezing present in RML and RLL.   Cardiac -- RRR. No murmurs noted.   FHT:  FHR: 150 bpm, variability: moderate,  accelerations:  Present,  decelerations:  Absent UC:   regular, every 4-5 SVE:   Dilation: 3 Effacement (%): 50 Station: -2 Exam by:: McEachron  Labs: Lab Results  Component Value Date   WBC 9.4 11/21/2014   HGB 11.3* 11/21/2014   HCT 35.3* 11/21/2014   MCV 88.7 11/21/2014   PLT 145* 11/21/2014    Assessment / Plan: Induction of labor due to preeclampsia and gestational hypertension,  progressing well on pitocin  Labor: Early induction, progressing well. Will continue to increase pitocin per protocol. Will consider AROM once able for further induction and placement of internal monitors.  Chronic Hypertension with Superimposed Preeclampsia:  Blood pressure mild range. HELLP labs negative. Continue to monitor for signs of severe disease. No indication for magnesium at this time but low threshold to start if indicated.  Fetal Wellbeing:  Category I Pain Control:  Epidural I/D:  GBS neg Anticipated MOD:  NSVD  William DaltonMcEachern, Madex Seals 11/21/2014, 15:30

## 2014-11-21 NOTE — Anesthesia Procedure Notes (Signed)
Epidural Patient location during procedure: OB  Preanesthetic Checklist Completed: patient identified, site marked, surgical consent, pre-op evaluation, timeout performed, IV checked, risks and benefits discussed and monitors and equipment checked  Epidural Patient position: sitting Prep: site prepped and draped and DuraPrep Patient monitoring: continuous pulse ox and blood pressure Approach: midline Location: L2-L3 Injection technique: LOR air  Needle:  Needle type: Tuohy  Needle gauge: 17 G Needle length: 9 cm and 9 Needle insertion depth: 6 cm Catheter type: closed end flexible Catheter size: 19 Gauge Catheter at skin depth: 12 cm Test dose: negative  Assessment Events: blood not aspirated, injection not painful, no injection resistance, negative IV test and no paresthesia  Additional Notes (-) SAB test dose Pump started. Will let pump slowly bring up level

## 2014-11-21 NOTE — Anesthesia Preprocedure Evaluation (Signed)
Anesthesia Evaluation  Patient identified by MRN, date of birth, ID band Patient awake    Reviewed: Allergy & Precautions, H&P , Patient's Chart, lab work & pertinent test results  Airway Mallampati: III  TM Distance: >3 FB Neck ROM: full    Dental   Pulmonary former smoker,    + wheezing      Cardiovascular hypertension, Rhythm:regular Rate:Normal     Neuro/Psych    GI/Hepatic   Endo/Other  diabetesMorbid obesity  Renal/GU      Musculoskeletal   Abdominal   Peds  Hematology  (+) anemia ,   Anesthesia Other Findings   Reproductive/Obstetrics (+) Pregnancy                             Anesthesia Physical Anesthesia Plan  ASA: III  Anesthesia Plan:    Post-op Pain Management:    Induction:   Airway Management Planned:   Additional Equipment:   Intra-op Plan:   Post-operative Plan:   Informed Consent: I have reviewed the patients History and Physical, chart, labs and discussed the procedure including the risks, benefits and alternatives for the proposed anesthesia with the patient or authorized representative who has indicated his/her understanding and acceptance.     Plan Discussed with:   Anesthesia Plan Comments:         Anesthesia Quick Evaluation

## 2014-11-22 ENCOUNTER — Encounter (HOSPITAL_COMMUNITY): Payer: Self-pay

## 2014-11-22 DIAGNOSIS — Z3A36 36 weeks gestation of pregnancy: Secondary | ICD-10-CM

## 2014-11-22 DIAGNOSIS — J4541 Moderate persistent asthma with (acute) exacerbation: Secondary | ICD-10-CM

## 2014-11-22 DIAGNOSIS — O1413 Severe pre-eclampsia, third trimester: Secondary | ICD-10-CM

## 2014-11-22 LAB — GLUCOSE, CAPILLARY
GLUCOSE-CAPILLARY: 105 mg/dL — AB (ref 70–99)
GLUCOSE-CAPILLARY: 101 mg/dL — AB (ref 70–99)
GLUCOSE-CAPILLARY: 113 mg/dL — AB (ref 70–99)
Glucose-Capillary: 107 mg/dL — ABNORMAL HIGH (ref 70–99)
Glucose-Capillary: 119 mg/dL — ABNORMAL HIGH (ref 70–99)
Glucose-Capillary: 85 mg/dL (ref 70–99)
Glucose-Capillary: 88 mg/dL (ref 70–99)
Glucose-Capillary: 90 mg/dL (ref 70–99)
Glucose-Capillary: 90 mg/dL (ref 70–99)
Glucose-Capillary: 94 mg/dL (ref 70–99)

## 2014-11-22 LAB — RPR: RPR: NONREACTIVE

## 2014-11-22 MED ORDER — FLEET ENEMA 7-19 GM/118ML RE ENEM: 1.0000 | ENEMA | Freq: Every day | RECTAL | Status: DC | PRN

## 2014-11-22 MED ORDER — BISACODYL 10 MG RE SUPP: 10.0000 mg | Freq: Every day | RECTAL | Status: DC | PRN

## 2014-11-22 MED ORDER — METHYLPREDNISOLONE SODIUM SUCC 40 MG IJ SOLR
40.0000 mg | Freq: Two times a day (BID) | INTRAMUSCULAR | Status: DC
  Administered 2014-11-22: 40 mg via INTRAVENOUS
  Filled 2014-11-22 (×2): qty 1

## 2014-11-22 MED ORDER — DIPHENHYDRAMINE HCL 50 MG/ML IJ SOLN
12.5000 mg | INTRAMUSCULAR | Status: DC | PRN
  Administered 2014-11-22: 12.5 mg via INTRAVENOUS
  Filled 2014-11-22: qty 1

## 2014-11-22 MED ORDER — SODIUM CHLORIDE 0.9 % IV SOLN: 250.0000 mL | INTRAVENOUS | Status: DC | PRN

## 2014-11-22 MED ORDER — IBUPROFEN 600 MG PO TABS
600.0000 mg | ORAL_TABLET | Freq: Four times a day (QID) | ORAL | Status: DC
  Administered 2014-11-23 – 2014-11-24 (×7): 600 mg via ORAL
  Filled 2014-11-22 (×7): qty 1

## 2014-11-22 MED ORDER — SODIUM CHLORIDE 0.9 % IJ SOLN
3.0000 mL | Freq: Two times a day (BID) | INTRAMUSCULAR | Status: DC
  Administered 2014-11-22 – 2014-11-23 (×2): 3 mL via INTRAVENOUS

## 2014-11-22 MED ORDER — OXYCODONE-ACETAMINOPHEN 5-325 MG PO TABS
1.0000 | ORAL_TABLET | ORAL | Status: DC | PRN
  Administered 2014-11-22 – 2014-11-24 (×6): 1 via ORAL
  Filled 2014-11-22 (×6): qty 1

## 2014-11-22 MED ORDER — DIPHENHYDRAMINE HCL 25 MG PO CAPS: 25.0000 mg | ORAL_CAPSULE | Freq: Four times a day (QID) | ORAL | Status: DC | PRN

## 2014-11-22 MED ORDER — MAGNESIUM SULFATE 40 G IN LACTATED RINGERS - SIMPLE
2.0000 g/h | INTRAVENOUS | Status: DC
  Filled 2014-11-22: qty 500

## 2014-11-22 MED ORDER — SENNOSIDES-DOCUSATE SODIUM 8.6-50 MG PO TABS
2.0000 | ORAL_TABLET | ORAL | Status: DC
  Administered 2014-11-23 – 2014-11-24 (×2): 2 via ORAL
  Filled 2014-11-22 (×2): qty 2

## 2014-11-22 MED ORDER — OXYTOCIN 40 UNITS IN LACTATED RINGERS INFUSION - SIMPLE MED: 62.5000 mL/h | INTRAVENOUS | Status: DC | PRN

## 2014-11-22 MED ORDER — BENZOCAINE-MENTHOL 20-0.5 % EX AERO: 1.0000 "application " | INHALATION_SPRAY | CUTANEOUS | Status: DC | PRN

## 2014-11-22 MED ORDER — MAGNESIUM SULFATE BOLUS VIA INFUSION
4.0000 g | Freq: Once | INTRAVENOUS | Status: DC
  Filled 2014-11-22: qty 500

## 2014-11-22 MED ORDER — ONDANSETRON HCL 4 MG/2ML IJ SOLN: 4.0000 mg | INTRAMUSCULAR | Status: DC | PRN

## 2014-11-22 MED ORDER — LANOLIN HYDROUS EX OINT: TOPICAL_OINTMENT | CUTANEOUS | Status: DC | PRN

## 2014-11-22 MED ORDER — DIBUCAINE 1 % RE OINT: 1.0000 "application " | TOPICAL_OINTMENT | RECTAL | Status: DC | PRN

## 2014-11-22 MED ORDER — SIMETHICONE 80 MG PO CHEW: 80.0000 mg | CHEWABLE_TABLET | ORAL | Status: DC | PRN

## 2014-11-22 MED ORDER — PRENATAL MULTIVITAMIN CH
1.0000 | ORAL_TABLET | Freq: Every day | ORAL | Status: DC
Start: 2014-11-23 — End: 2014-11-24
  Administered 2014-11-23 – 2014-11-24 (×2): 1 via ORAL
  Filled 2014-11-22 (×2): qty 1

## 2014-11-22 MED ORDER — SODIUM CHLORIDE 0.9 % IJ SOLN: 3.0000 mL | INTRAMUSCULAR | Status: DC | PRN

## 2014-11-22 MED ORDER — MISOPROSTOL 200 MCG PO TABS
1000.0000 ug | ORAL_TABLET | Freq: Once | ORAL | Status: DC
  Filled 2014-11-22: qty 5

## 2014-11-22 MED ORDER — ZOLPIDEM TARTRATE 5 MG PO TABS: 5.0000 mg | ORAL_TABLET | Freq: Every evening | ORAL | Status: DC | PRN

## 2014-11-22 MED ORDER — WITCH HAZEL-GLYCERIN EX PADS: 1.0000 "application " | MEDICATED_PAD | CUTANEOUS | Status: DC | PRN

## 2014-11-22 MED ORDER — FUROSEMIDE 10 MG/ML IJ SOLN
40.0000 mg | Freq: Four times a day (QID) | INTRAMUSCULAR | Status: AC
  Administered 2014-11-22 (×2): 40 mg via INTRAVENOUS
  Filled 2014-11-22 (×2): qty 4

## 2014-11-22 MED ORDER — ONDANSETRON HCL 4 MG PO TABS: 4.0000 mg | ORAL_TABLET | ORAL | Status: DC | PRN

## 2014-11-22 NOTE — Progress Notes (Signed)
Breathing labored with audible wheezes.  Pt states that she is feeling anxious with lack of ability to move air again. REspiatory in for breathing treatment

## 2014-11-22 NOTE — Progress Notes (Signed)
Dr Rachelle HoraMoss notified of CBG's.  Decision made to hold 0800 dose of Metformin

## 2014-11-22 NOTE — Progress Notes (Signed)
LABOR PROGRESS NOTE  Jaime BlueJennifer Morrow is a 39 y.o. [phone] at 2969w6d  admitted for IOL 2/2 preE with severe features, still with asthma exacerbation  Subjective: Declines magnesium, denies HA/visual changes/RUQ pain, +significant LE swelling  Objective: BP 122/69 mmHg  Pulse 95  Temp(Src) 98.1 F (36.7 C) (Oral)  Resp 20  Ht 5\' 4"  (1.626 m)  Wt 246 lb 6.4 oz (111.766 kg)  BMI 42.27 kg/m2  SpO2 97%  LMP 03/09/2014 or  Filed Vitals:   11/22/14 0809 11/22/14 0832 11/22/14 0901 11/22/14 0932  BP:  135/67 143/82 122/69  Pulse:  98 89 95  Temp:      TempSrc:      Resp: 20 28 22 20   Height:      Weight:      SpO2:        Cat I, difficult to trace baby 2/2 body habitus and mom's positioning  Dilation: 3 Effacement (%): 50 Cervical Position: Posterior Station: Ballotable Presentation: Vertex Exam by:: Dr. Moss/C. Jearld FentonByers, RN Lungs: wheezing in all lung fields, labored breathing LE: 3+ pitting taut edema  Labs: Lab Results  Component Value Date   WBC 9.4 11/21/2014   HGB 11.3* 11/21/2014   HCT 35.3* 11/21/2014   MCV 88.7 11/21/2014   PLT 145* 11/21/2014    Patient Active Problem List   Diagnosis Date Noted  . Gestational hypertension without significant proteinuria in third trimester 11/21/2014  . Indication for care in labor and delivery, antepartum 11/21/2014  . Diabetes in pregnancy 11/21/2014  . Suspected macroscopic fetus 11/20/2014  . Type 2 diabetes mellitus during pregnancy   . Insulin controlled gestational diabetes mellitus in third trimester   . Preexisting diabetes complicating pregnancy, antepartum 06/17/2014  . High-risk pregnancy in multigravida greater than 41 years of age, antepartum 06/17/2014  . Pain in symphysis pubis during pregnancy 06/17/2014  . 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 Grand multipara 02/17/2013  . Obesity complicating pregnancy 02/09/2013  . AMA (advanced maternal age) multigravida 35+ 02/09/2013  . Asthma 09/25/2011     Assessment / Plan: 39 y.o. [id number] at 6869w6d here for IOL 2/2 preE with severe features  Labor: on pit break now, will restart in 1 hour, AROM when able with caution Fetal Wellbeing:  Cat I Pain Control:  epidural Anticipated MOD:  Vaginal  PreE: declines magnesium, discussed risks and benefits including risk of seizure, still declines => if worsening BP, HA/visual changes/any signs of worsening will start magnesium => Reorder preE labs to monitor closely Asthma exacerbation: worse this morning than was last night, continue methylpred q12h, continue scheduled xoponex nebs, give lasix 40mg  IV q6h x 2 as this may be contributing to respiratory distress   Jaime Captain ROCIO, MD 11/22/2014, 10:07 AM

## 2014-11-22 NOTE — Progress Notes (Signed)
Jaime Morrow is a 39 y.o. [id number] at 3148w6d admitted for induction of labor due to Gestational HTN with superimposed preeclampsia.  Subjective: Pt is feeling well. Headache resolved with percocet. Most recent blood pressures have been 157/77, 149/73, and 157/78.  Objective: BP 158/79 mmHg  Pulse 94  Temp(Src) 98.2 F (36.8 C) (Oral)  Resp 22  Ht 5\' 4"  (1.626 m)  Wt 111.766 kg (246 lb 6.4 oz)  BMI 42.27 kg/m2  SpO2 98%  LMP 03/09/2014 I/O last 3 completed shifts: In: 2313.5 [P.O.:1380; I.V.:933.5] Out: 1925 [Urine:1925] Total I/O In: 573 [P.O.:360; I.V.:213] Out: 125 [Urine:125]  FHT:  FHR: 145 bpm, variability: moderate,  accelerations:  Present,  decelerations:  Present couple late vs variable UC:   irregular, every 4-5 minutes SVE:   Dilation: 2.5 Effacement (%): 50 Station: Ballotable Exam by:: Honeycutt, RN  Labs: Lab Results  Component Value Date   WBC 9.4 11/21/2014   HGB 11.3* 11/21/2014   HCT 35.3* 11/21/2014   MCV 88.7 11/21/2014   PLT 145* 11/21/2014    Assessment / Plan: Jaime Morrow is a 39 y.o. [id number] at 9148w6d admitted for induction of labor due to Gestational HTN with superimposed preeclampsia.  Labor: Little to no cervical change since last exam. Increase pit. AROM is not appropriate at this time. Preeclampsia:  no signs or symptoms of toxicity and SBP 150s over last hour. will continue to monitor closely. low threshold for starting mag if bp in severe range. Fetal Wellbeing:  Category II Pain Control:  Epidural I/D:  GBS neg Anticipated MOD:  NSVD  [name], Josey Forcier 11/22/2014, 12:15 AM

## 2014-11-22 NOTE — Progress Notes (Signed)
Jaime Morrow is a 39 y.o. [id number] at 161w6d  admitted for induction of labor due to [facility]HT[facility] with superimposed preclampsia.  Subjective: Feeling some upper abdominal contractions. No other complaints.  Objective: BP 132/67 mmHg  Pulse 88  Temp(Src) 98.7 F (37.1 C) (Oral)  Resp 20  Ht 5\' 4"  (1.626 m)  Wt 111.766 kg (246 lb 6.4 oz)  BMI 42.27 kg/m2  SpO2 97%  LMP 03/09/2014 I/O last 3 completed shifts: In: 4363.8 [P.O.:2580; I.V.:1783.8] Out: 3675 [Urine:3675]    FHT:  FHR: 135 bpm, variability: moderate,  accelerations:  Present,  decelerations:  Absent UC:   irregular SVE:   Dilation: 3 Effacement (%): 50 Station: Ballotable Exam by:: Dr. Ewell Benassi/C. Jearld FentonByers, RN  Labs: Lab Results  Component Value Date   WBC 9.4 11/21/2014   HGB 11.3* 11/21/2014   HCT 35.3* 11/21/2014   MCV 88.7 11/21/2014   PLT 145* 11/21/2014    Assessment / Plan: IOL for GHTN with superimposed preeclampsia  Labor: on pitocin. did not re-evaluate cervix at this time. may require high AROM later in the day due to fetal position. Preeclampsia:  no signs or symptoms of toxicity Fetal Wellbeing:  Category I Pain Control:  Epidural I/D:  gbs neg Anticipated MOD:  NSVD  Jaime Morrow, Jaime Morrow 11/22/2014, 7:23 AM

## 2014-11-22 NOTE — Progress Notes (Signed)
Jaime Morrow is a 39 y.o. [id number] at 5645w6d  admitted for induction of labor [facility] to [facility] with superimposed preeclampsia.  Subjective:  Pt is feeling itchy all over but otherwise not complaints.  Objective: BP 146/82 mmHg  Pulse 100  Temp(Src) 98.7 F (37.1 C) (Oral)  Resp 22  Ht 5\' 4"  (1.626 m)  Wt 111.766 kg (246 lb 6.4 oz)  BMI 42.27 kg/m2  SpO2 97%  LMP 03/09/2014 I/O last 3 completed shifts: In: 2313.5 [P.O.:1380; I.V.:933.5] Out: 1925 [Urine:1925] Total I/O In: 1156.4 [P.O.:720; I.V.:436.4] Out: 600 [Urine:600]  FHT:  FHR: 140 bpm, variability: moderate,  accelerations:  Present,  decelerations:  Present single late decel UC:   irregular, every 3 minutes SVE:   Dilation: 3 Effacement (%): 50 Station: Ballotable Exam by:: Jaime Nay. Byers, RN  Labs: Lab Results  Component Value Date   WBC 9.4 11/21/2014   HGB 11.3* 11/21/2014   HCT 35.3* 11/21/2014   MCV 88.7 11/21/2014   PLT 145* 11/21/2014    Assessment / Plan: IOL for GHTN with superimposed preeclampsia  Labor: on 22 pitocin. little cervical change since last check. AROM inappropriate at this time. Preeclampsia:  no signs or symptoms of toxicity Fetal Wellbeing:  Category II Pain Control:  Epidural I/D:  n/a Anticipated MOD:  NSVD  Jaime Morrow, Jaime Morrow 11/22/2014, 3:57 AM

## 2014-11-22 NOTE — Progress Notes (Signed)
US performed to determine presentation. Vertex positioning determined w/ fetal head visualized at lower uterine segment.  Advised nursing that restarting pitocin is appropriate at this time. Will AROM now with positive vertex position and determination of how low fetal head is located.  Kathee DeltonIan D McKeag, MD,MS,  PGY1 11/22/2014 11:28 AM

## 2014-11-23 ENCOUNTER — Ambulatory Visit (HOSPITAL_COMMUNITY): Payer: Medicaid Other

## 2014-11-23 DIAGNOSIS — J9600 Acute respiratory failure, unspecified whether with hypoxia or hypercapnia: Secondary | ICD-10-CM

## 2014-11-23 DIAGNOSIS — R0600 Dyspnea, unspecified: Secondary | ICD-10-CM

## 2014-11-23 LAB — CBC
HEMATOCRIT: 36.5 % (ref 36.0–46.0)
Hemoglobin: 11.7 g/dL — ABNORMAL LOW (ref 12.0–15.0)
MCH: 28 pg (ref 26.0–34.0)
MCHC: 32.1 g/dL (ref 30.0–36.0)
MCV: 87.3 fL (ref 78.0–100.0)
Platelets: 200 10*3/uL (ref 150–400)
RBC: 4.18 MIL/uL (ref 3.87–5.11)
RDW: 15.2 % (ref 11.5–15.5)
WBC: 13.1 10*3/uL — ABNORMAL HIGH (ref 4.0–10.5)

## 2014-11-23 LAB — GLUCOSE, CAPILLARY
GLUCOSE-CAPILLARY: 135 mg/dL — AB (ref 70–99)
GLUCOSE-CAPILLARY: 105 mg/dL — AB (ref 70–99)

## 2014-11-23 MED ORDER — PREDNISONE 20 MG PO TABS
40.0000 mg | ORAL_TABLET | Freq: Every day | ORAL | Status: DC
  Administered 2014-11-23 – 2014-11-24 (×2): 40 mg via ORAL
  Filled 2014-11-23 (×3): qty 2

## 2014-11-23 MED ORDER — LEVALBUTEROL HCL 0.63 MG/3ML IN NEBU
0.6300 mg | INHALATION_SOLUTION | Freq: Four times a day (QID) | RESPIRATORY_TRACT | Status: DC | PRN
  Administered 2014-11-24: 0.63 mg via RESPIRATORY_TRACT
  Filled 2014-11-23 (×2): qty 3

## 2014-11-23 MED ORDER — FUROSEMIDE 10 MG/ML IJ SOLN
40.0000 mg | Freq: Four times a day (QID) | INTRAMUSCULAR | Status: DC
  Administered 2014-11-23: 40 mg via INTRAVENOUS
  Filled 2014-11-23: qty 4

## 2014-11-23 MED ORDER — METFORMIN HCL 500 MG PO TABS
1000.0000 mg | ORAL_TABLET | Freq: Two times a day (BID) | ORAL | Status: DC
  Administered 2014-11-23 – 2014-11-24 (×3): 1000 mg via ORAL
  Filled 2014-11-23 (×5): qty 2

## 2014-11-23 NOTE — Anesthesia Postprocedure Evaluation (Signed)
Anesthesia Post Note  Patient: Jaime Morrow  Procedure(s) Performed: * No procedures listed *  Anesthesia type: Epidural  Patient location: Women's unit  Post pain: Pain level controlled  Post assessment: Post-op Vital signs reviewed  Last Vitals:  Filed Vitals:   11/23/14 0542  BP: 112/87  Pulse: 91  Temp: 36.8 C  Resp: 18    Post vital signs: Reviewed  Level of consciousness:alert  Complications: No apparent anesthesia complications

## 2014-11-23 NOTE — Progress Notes (Signed)
Post Partum Day 2 Subjective:  Pt still has some wheezingh nad SBO from Asthma accerbation. Pt denies heavy bleeding, headache, scotomata, RUQ pain.   Voiding, amublating, tol po  Objective: Blood pressure 127/68, pulse 94, temperature 98.1 F (36.7 C), temperature source Oral, resp. rate 18, height 5\' 4"  (1.626 m), weight 246 lb 6.4 oz (111.766 kg), last menstrual period 03/09/2014, SpO2 99 %, unknown if currently breastfeeding.  Physical Exam:  General: alert, cooperative and no distress Lochia: appropriate Uterine Fundus: firm Incision: none DVT Evaluation: No evidence of DVT seen on physical exam. Lungs:  Bilateral rhonchi with decreased air exchange  Recent Labs  11/21/14 0820 11/22/14 2020  HGB 11.3* 11.7*  HCT 35.3* 36.5    Assessment/Plan: Asthma:  Continue xopenex and prednisone CV:  Echo pending OB:  Gestational hypertension/preeclampsia resolving Endo:  Type 2 diabetes controlled with metformin.  Will monitor CBGs since patient is on steroids.  Birth control:  Depo Breast feeding     LOS: 3 days   Rumor Sun H. 11/23/2014, 12:47 PM

## 2014-11-23 NOTE — Progress Notes (Signed)
ECHO ordered 2/2 suspected volume overload although CXR reassuring. Strict ins and outs, daily weights.   Prednisone 40mg  qday x 3days for acute asthma exacerbation.  Xoponex now prn, no longer scheduled.  Metformin restarted for pre-gestational DM.    Perry MountACOSTA,Kanen Mottola ROCIO, MD 9:10 AM

## 2014-11-23 NOTE — Progress Notes (Signed)
Ur chart review completed.  

## 2014-11-23 NOTE — Lactation Note (Signed)
This note was copied from the chart of Jaime Morrow. Lactation Consultation Note  Patient Name: Jaime Krista BlueJennifer Fitzwater WUJWJ'XToday's Date: 11/23/2014 Reason for consult: Initial assessment Baby 18 hours of life. Mom reports that she has nursed 8 of her 789 older children for around 16 months. Mom given [facility] broch[facility], aware of OP/BFSG, community resources, and [facility] phone line assistance after D/C. Enc to call for assistance as needed.   Maternal Data Formula Feeding for Exclusion: No Does the patient have breastfeeding experience prior to this delivery?: Yes  Feeding    LATCH Score/Interventions                      Lactation Tools Discussed/Used     Consult Status Consult Status: PRN    Geralynn OchsWILLIARD, Avie 11/23/2014, 1:26 PM

## 2014-11-23 NOTE — Progress Notes (Signed)
Echocardiogram 2D Echocardiogram has been performed.  Remi Lopata 11/23/2014, 2:05 PM

## 2014-11-24 LAB — GLUCOSE, CAPILLARY
GLUCOSE-CAPILLARY: 83 mg/dL (ref 70–99)
GLUCOSE-CAPILLARY: 129 mg/dL — AB (ref 70–99)

## 2014-11-24 MED ORDER — METFORMIN HCL 1000 MG PO TABS: 1000.0000 mg | ORAL_TABLET | Freq: Two times a day (BID) | ORAL | Status: DC

## 2014-11-24 MED ORDER — IBUPROFEN 600 MG PO TABS: 600.0000 mg | ORAL_TABLET | Freq: Four times a day (QID) | ORAL | Status: DC | PRN

## 2014-11-24 MED ORDER — PREDNISONE 20 MG PO TABS: 40.0000 mg | ORAL_TABLET | Freq: Every day | ORAL | Status: DC

## 2014-11-24 NOTE — Discharge Instructions (Signed)

## 2014-11-24 NOTE — Discharge Summary (Signed)
Obstetric Discharge Summary Reason for Admission: induction of labor Prenatal Procedures: NST, Preeclampsia and ultrasound Intrapartum Procedures: spontaneous vaginal delivery Postpartum Procedures: Echo Complications-Operative and Postpartum: HTN (patient refused Mag)   Delivery complicated by acute asthma exacerbation. Treated w/ methylpred BID x2days, albuterol (made tachy so DCd) and xoponex. CXR showed NO signs of lung infiltrate but possible pulm congestion. Diuresed w/ lasix interpartum and postpartum day#0 >> significant improvement. Echo to r/o valvular cause to congestion >> echo wnl, EF60-65%.  HEMOGLOBIN  Date Value Ref Range Status  11/22/2014 11.7* 12.0 - 15.0 g/dL Final   HCT  Date Value Ref Range Status  11/22/2014 36.5 36.0 - 46.0 % Final    Physical Exam:  General: alert, cooperative, appears stated age, no distress and moderately obese Lochia: appropriate Uterine Fundus: firm Incision: n/a DVT Evaluation: No evidence of DVT seen on physical exam. No cords or calf tenderness. No significant calf/ankle edema.  Discharge Diagnoses: Term Pregnancy-delivered and acute asthma exacerbation  Discharge Information: Date: 11/24/2014 Activity: pelvic rest Diet: routine Medications: PNV and metformin Prednisone, Albuterol PRN.  Condition: stable Instructions: refer to practice specific booklet Discharge to: home Follow-up Information    [facility] [place]. Schedule an appointment as soon as possible for a visit in 6 weeks.   Specialty:  Obstetrics and Gynecology   Contact information:   [address], [address] [place] [id number] [phone]      Newborn Data: Live born female  Birth Weight: 7 lb 15.9 oz (3625 g) APGAR: 7, 9  Home with mother.  Kathee DeltonMcKeag, Ian D 11/24/2014, 7:44 AM   I was present for the exam and agree with above. Still having some increased WOB. O2 Sat 98% on RA. Slight wheezing in  RLL. Rare, non-productive cough. Attributes to allergies.Hasn't taken Zyrtec x 2 days since being in hospital. Will have pt resume zyrtec and continue prednisone 40 mg PO QD x 3 more days per consult w/ Dr. Adrian BlackwaterStinson. Strongly encouraged pt to establish care w/ PCP for asthma and DM. Pt agreeable. Will go to husbands PCP.   [place] Damacio Weisgerber, CNM 11/24/2014 11:50 AM

## 2014-11-24 NOTE — Progress Notes (Signed)
Pt ambulated out

## 2014-12-23 ENCOUNTER — Ambulatory Visit (INDEPENDENT_AMBULATORY_CARE_PROVIDER_SITE_OTHER): Payer: Medicaid Other | Admitting: Family

## 2014-12-23 ENCOUNTER — Other Ambulatory Visit (HOSPITAL_COMMUNITY)
Admission: RE | Admit: 2014-12-23 | Discharge: 2014-12-23 | Disposition: A | Discharge status: Home / Self Care | Payer: Medicaid Other | Source: Ambulatory Visit | Attending: Family | Admitting: Family

## 2014-12-23 ENCOUNTER — Encounter: Payer: Self-pay | Admitting: Family

## 2014-12-23 DIAGNOSIS — Z1151 Encounter for screening for human papillomavirus (HPV): Secondary | ICD-10-CM | POA: Diagnosis present

## 2014-12-23 DIAGNOSIS — Z01419 Encounter for gynecological examination (general) (routine) without abnormal findings: Secondary | ICD-10-CM | POA: Diagnosis not present

## 2014-12-23 NOTE — Progress Notes (Signed)
  Subjective:     Jaime Morrow is a 39 y.o. female who presents for a postpartum visit. She is 4 weeks postpartum following a spontaneous vaginal delivery. I have fully reviewed the prenatal and intrapartum course. The delivery was at 36 gestational weeks. Outcome: spontaneous vaginal delivery. Anesthesia: epidural. Postpartum course has been unremarkable. Baby's course has been unremarkable. Baby is feeding by breast. Bleeding no bleeding.  Last bleeding one week ago.   Bowel function is normal. Bladder function is normal. Patient is not sexually active. Contraception method is none. Postpartum depression screening: negative.  The following portions of the patient's history were reviewed and updated as appropriate: allergies, current medications, past family history, past medical history, past social history, past surgical history and problem list.  Review of Systems Pertinent items are noted in HPI.   Objective:   General:  alert, cooperative and appears stated age   Breasts:  inspection negative, no nipple discharge or bleeding, no masses or nodularity palpable  Lungs: clear to auscultation bilaterally  Heart:  regular rate and rhythm, S1, S2 normal, no murmur, click, rub or gallop  Abdomen: soft, non-tender; bowel sounds normal; no masses,  no organomegaly   Vulva:  normal  Vagina: normal vagina, no discharge, exudate, lesion, or erythema; healed well  Cervix:  no cervical motion tenderness  Corpus: normal size, contour, position, consistency, mobility, non-tender  Adnexa:  normal adnexa  Rectal Exam: Not performed.          Assessment:     Normal postpartum exam. Pap smear done at today's visit.   Plan:    1. Contraception: none.  Patient still undecided - considering Natural Family Method.   2. Follow up as needed.    Eino FarberWalidah Kennith GainN Karim, CNM

## 2014-12-26 LAB — CYTOLOGY - PAP

## 2015-06-09 ENCOUNTER — Encounter: Payer: Self-pay | Admitting: Osteopathic Medicine

## 2015-06-09 ENCOUNTER — Ambulatory Visit (INDEPENDENT_AMBULATORY_CARE_PROVIDER_SITE_OTHER): Payer: Self-pay | Admitting: Osteopathic Medicine

## 2015-06-09 VITALS — BP 133/81 | HR 94 | Ht 65.0 in | Wt 231.0 lb

## 2015-06-09 DIAGNOSIS — L679 Hair color and hair shaft abnormality, unspecified: Secondary | ICD-10-CM | POA: Insufficient documentation

## 2015-06-09 DIAGNOSIS — F329 Major depressive disorder, single episode, unspecified: Secondary | ICD-10-CM

## 2015-06-09 DIAGNOSIS — L680 Hirsutism: Secondary | ICD-10-CM

## 2015-06-09 DIAGNOSIS — E119 Type 2 diabetes mellitus without complications: Secondary | ICD-10-CM

## 2015-06-09 DIAGNOSIS — O24414 Gestational diabetes mellitus in pregnancy, insulin controlled: Secondary | ICD-10-CM

## 2015-06-09 DIAGNOSIS — G4700 Insomnia, unspecified: Secondary | ICD-10-CM

## 2015-06-09 DIAGNOSIS — R5382 Chronic fatigue, unspecified: Secondary | ICD-10-CM

## 2015-06-09 DIAGNOSIS — R635 Abnormal weight gain: Secondary | ICD-10-CM

## 2015-06-09 DIAGNOSIS — F32A Depression, unspecified: Secondary | ICD-10-CM | POA: Insufficient documentation

## 2015-06-09 LAB — LIPID PANEL
CHOLESTEROL: 220 mg/dL — AB (ref 125–200)
HDL: 26 mg/dL — ABNORMAL LOW (ref >=46)
TRIGLYCERIDES: 650 mg/dL — AB (ref <150)
Total CHOL/HDL Ratio: 8.5 Ratio — ABNORMAL HIGH (ref <=5.0)

## 2015-06-09 LAB — CBC
HCT: 42.1 % (ref 36.0–46.0)
Hemoglobin: 14.5 g/dL (ref 12.0–15.0)
MCH: 30.9 pg (ref 26.0–34.0)
MCHC: 34.4 g/dL (ref 30.0–36.0)
MCV: 89.8 fL (ref 78.0–100.0)
MPV: 10.1 fL (ref 8.6–12.4)
PLATELETS: 238 10*3/uL (ref 150–400)
RBC: 4.69 MIL/uL (ref 3.87–5.11)
RDW: 13.1 % (ref 11.5–15.5)
WBC: 8.7 10*3/uL (ref 4.0–10.5)

## 2015-06-09 LAB — COMPREHENSIVE METABOLIC PANEL
ALT: 42 U/L — ABNORMAL HIGH (ref 6–29)
AST: 36 U/L — ABNORMAL HIGH (ref 10–30)
Albumin: 4.2 g/dL (ref 3.6–5.1)
Alkaline Phosphatase: 96 U/L (ref 33–115)
BILIRUBIN TOTAL: 0.5 mg/dL (ref 0.2–1.2)
BUN: 15 mg/dL (ref 7–25)
CALCIUM: 9.1 mg/dL (ref 8.6–10.2)
CO2: 26 mmol/L (ref 20–31)
Chloride: 101 mmol/L (ref 98–110)
Creat: 0.53 mg/dL (ref 0.50–1.10)
Glucose, Bld: 122 mg/dL — ABNORMAL HIGH (ref 65–99)
Potassium: 4.4 mmol/L (ref 3.5–5.3)
Sodium: 136 mmol/L (ref 135–146)
Total Protein: 7.3 g/dL (ref 6.1–8.1)

## 2015-06-09 MED ORDER — METFORMIN HCL 1000 MG PO TABS: 1000.0000 mg | ORAL_TABLET | Freq: Two times a day (BID) | ORAL | Status: DC

## 2015-06-09 NOTE — Progress Notes (Signed)
HPI: Jaime Morrow is a 38 y.o. female who presents to [facility]  today for fatigue, diabetes.   DM2 - worse; Has not seen PCP for DM2 care in some time. Was taking Metformin but hasn't been taking much. Hasn't been checking her sugars at home, doesn't have testing supplies.   HAIR/SKIN - worse: Reports hirsutism on face/neck/chin new problems over the past few months to a year, hair loss on head but this hair has been growing back. No rashes. No fever/chills. Mother told her to stop drinking smoothies with spinach because this might be affecting her thyroid.   PSYCH - new: Reports depression, decreased sex drive, fatigue, decreased ability to concentrate, appetite changes (increased, binge problems). Stressors include children and husband, husband is concerned that she get checked out.    Past medical, social and family history reviewed: Past Medical History  Diagnosis Date  . Anemia   . Asthma   . Blood transfusion abn reaction or complication, no procedure mishap   . 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 Gestational diabetes     insulin and metformin  . Preexisting diabetes complicating pregnancy, antepartum 11/30/2012  . Ruptured lumbar disc   . Type II diabetes mellitus    Past Surgical History  Procedure Laterality Date  . Dilation and curettage of uterus  2005 x 2  . Lumbar laminectomy  2007  . Lumbar disc surgery     Social History  Substance Use Topics  . Smoking status: Former Smoker -- 3.00 packs/day for 5 years  . Smokeless tobacco: Never Used  . Alcohol Use: No   Family History  Problem Relation Age of Onset  . COPD Maternal Grandmother   . Diabetes type II Maternal Grandmother   . Rheum arthritis Maternal Grandmother   . Hyperlipidemia Maternal Grandmother   . Diabetes Maternal Grandmother   . Cancer Maternal Grandfather     colon  . Alcohol abuse Maternal Grandfather   . Hyperlipidemia  Maternal Grandfather   . Alcohol abuse Paternal Grandfather   . Hyperlipidemia Mother   . Thyroid disease Mother   . Hyperlipidemia Father   . Hypertension Father   . Thyroid disease Father   . Diabetes Father   . Alcohol abuse Brother   . Cancer Maternal Aunt     skin  . Heart attack Maternal Uncle      No Known Allergies   Review of Systems: CONSTITUTIONAL: Neg fever/chills, reports binge eating, (+) fatigue, weight gain HEAD/EYES/EARS/NOSE: No headache/vision change or hearing change CARDIAC: No chest pain/pressure/palpitations, no orthopnea RESPIRATORY: No cough/shortness of breath/wheeze GASTROINTESTINAL: No nausea/vomiting/abdominal pain/blood in stool. reports some alternating diarrhea and constipation, poor diet,  MUSCULOSKELETAL: No myalgia/arthralgia GENITOURINARY: No incontinence, No abnormal genital bleeding/discharge ENDOCRINE: No heat/cold intolerance, (+) fatigue,(+)Dm2 SKIN: No rash/wounds/concerning lesions. Hair changes as per HPI.  HEM/ONC: No easy bruising/bleeding, no abnormal lymph node PSYCHIATRIC: (+) concerns with depression/anxiety and sleep problems, see above   Exam:  BP 133/81 mmHg  Pulse 94  Ht  (1.651 m)  Wt 231 lb (104.781 kg)  BMI 38.44 kg/m2 Constitutional: VSS, see above. General Appearance: alert, well-developed, well-nourished, NAD Eyes: Normal lids and conjunctive, non-icteric sclera, PERRLA Ears, Nose, Mouth, Throat: Normal external inspection ears/nares/mouth/lips/gums, Normal TM bilaterally, MMM, posterior pharynx without erythema/exudate Neck: No masses, trachea midline. No thyroid enlargement/tenderness/mass appreciated Respiratory: Normal respiratory effort. No dullness/hyper-resonance to percussion. Breath sounds normal, no wheeze/rhonchi/rales Cardiovascular: S1/S2 normal, no murmur/rub/gallop auscultated. No carotid  bruit or JVD. No abdominal aortic bruit. Pedal pulse II/IV bilaterally DP and PT. No lower extremity  edema. Gastrointestinal: Nontender, no masses. No hepatomegaly, no splenomegaly appreciated however obesity limits exam. No hernia appreciated. Rectal exam deferred.  Musculoskeletal: Gait normal. No clubbing/cyanosis of digits.  Neurological: No cranial nerve deficit on limited exam. Motor and sensation intact and symmetric Psychiatric: Normal judgment/insight. Normal mood and affect. Oriented x3.  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 No results found.   ASSESSMENT/PLAN:  Type 2 diabetes mellitus without complication - Plan: TSH, Comprehensive metabolic panel, Hemoglobin A1c, Lipid panel, Diabetes foot exam done today normal  Weight gain - Plan: CBC, TSH  Chronic fatigue - Plan: CBC, TSH  Hair changes - Plan: Testosterone, free, Prolactin, 17-Hydroxyprogesterone (if able to afford but not as essential as other labs)  Depression (emotion) & Insomnia - RTC for further eval assuming labs do not indicate secondary cause. Suspect MDD.   Plan: metFORMIN (GLUCOPHAGE) 1000 MG tablet target, pt instructed on titration up as tolerated, Patient has been educated on significant possible side effects of medication and is instructed to contact me or other medical professional with any concerns about side effects.   Counseled on diet/exercise, healthy eating strategies to improve overall phsical and mental health.

## 2015-06-09 NOTE — Patient Instructions (Signed)
Diabetes Mellitus and Food It is important for you to manage your blood sugar (glucose) level. Your blood glucose level can be greatly affected by what you eat. Eating healthier foods in the appropriate amounts throughout the day at about the same time each day will help you control your blood glucose level. It can also help slow or prevent worsening of your diabetes mellitus. Healthy eating may even help you improve the level of your blood pressure and reach or maintain a healthy weight.  HOW CAN FOOD AFFECT ME? Carbohydrates Carbohydrates affect your blood glucose level more than any other type of food. Your dietitian will help you determine how many carbohydrates to eat at each meal and teach you how to count carbohydrates. Counting carbohydrates is important to keep your blood glucose at a healthy level, especially if you are using insulin or taking certain medicines for diabetes mellitus. Alcohol Alcohol can cause sudden decreases in blood glucose (hypoglycemia), especially if you use insulin or take certain medicines for diabetes mellitus. Hypoglycemia can be a life-threatening condition. Symptoms of hypoglycemia (sleepiness, dizziness, and disorientation) are similar to symptoms of having too much alcohol.  If your health care provider has given you approval to drink alcohol, do so in moderation and use the following guidelines:  Women should not have more than one drink per day, and men should not have more than two drinks per day. One drink is equal to:  12 oz of beer.  5 oz of wine.  1 oz of hard liquor.  Do not drink on an empty stomach.  Keep yourself hydrated. Have water, diet soda, or unsweetened iced tea.  Regular soda, juice, and other mixers might contain a lot of carbohydrates and should be counted. WHAT FOODS ARE NOT RECOMMENDED? As you make food choices, it is important to remember that all foods are not the same. Some foods have fewer nutrients per serving than other  foods, even though they might have the same number of calories or carbohydrates. It is difficult to get your body what it needs when you eat foods with fewer nutrients. Examples of foods that you should avoid that are high in calories and carbohydrates but low in nutrients include:  Trans fats (most processed foods list trans fats on the Nutrition Facts label).  Regular soda.  Juice.  Candy.  Sweets, such as cake, pie, doughnuts, and cookies.  Fried foods. WHAT FOODS CAN I EAT? Have nutrient-rich foods, which will nourish your body and keep you healthy. The food you should eat also will depend on several factors, including:  The calories you need.  The medicines you take.  Your weight.  Your blood glucose level.  Your blood pressure level.  Your cholesterol level. You also should eat a variety of foods, including:  Protein, such as meat, poultry, fish, tofu, nuts, and seeds (lean animal proteins are best).  Fruits.  Vegetables.  Dairy products, such as milk, cheese, and yogurt (low fat is best).  Breads, grains, pasta, cereal, rice, and beans.  Fats such as olive oil, trans fat-free margarine, canola oil, avocado, and olives. DOES EVERYONE WITH DIABETES MELLITUS HAVE THE SAME MEAL PLAN? Because every person with diabetes mellitus is different, there is not one meal plan that works for everyone. It is very important that you meet with a dietitian who will help you create a meal plan that is just right for you. Document Released: 07/04/2005 Document Revised: 10/12/2013 Document Reviewed: 09/03/2013 [employer] Patient Information 2015 [employer]. This   information is not intended to replace advice given to you by your health care provider. Make sure you discuss any questions you have with your health care provider.     Calorie Counting for Weight Loss Calories are energy you get from the things you eat and drink. Your body uses this energy to keep you going throughout  the day. The number of calories you eat affects your weight. When you eat more calories than your body needs, your body stores the extra calories as fat. When you eat fewer calories than your body needs, your body burns fat to get the energy it needs. Calorie counting means keeping track of how many calories you eat and drink each day. If you make sure to eat fewer calories than your body needs, you should lose weight. In order for calorie counting to work, you will need to eat the number of calories that are right for you in a day to lose a healthy amount of weight per week. A healthy amount of weight to lose per week is usually 1-2 lb (0.5-0.9 kg). A dietitian can determine how many calories you need in a day and give you suggestions on how to reach your calorie goal.  WHAT IS MY MY PLAN? My goal is to have __________ calories per day.  If I have this many calories per day, I should lose around __________ pounds per week. WHAT DO I NEED TO KNOW ABOUT CALORIE COUNTING? In order to meet your daily calorie goal, you will need to:  Find out how many calories are in each food you would like to eat. Try to do this before you eat.  Decide how much of the food you can eat.  Write down what you ate and how many calories it had. Doing this is called keeping a food log. WHERE DO I FIND CALORIE INFORMATION? The number of calories in a food can be found on a Nutrition Facts label. Note that all the information on a label is based on a specific serving of the food. If a food does not have a Nutrition Facts label, try to look up the calories online or ask your dietitian for help. HOW DO I DECIDE HOW MUCH TO EAT? To decide how much of the food you can eat, you will need to consider both the number of calories in one serving and the size of one serving. This information can be found on the Nutrition Facts label. If a food does not have a Nutrition Facts label, look up the information online or ask your dietitian  for help. Remember that calories are listed per serving. If you choose to have more than one serving of a food, you will have to multiply the calories per serving by the amount of servings you plan to eat. For example, the label on a package of bread might say that a serving size is 1 slice and that there are 90 calories in a serving. If you eat 1 slice, you will have eaten 90 calories. If you eat 2 slices, you will have eaten 180 calories. HOW DO I KEEP A FOOD LOG? After each meal, record the following information in your food log:  What you ate.  How much of it you ate.  How many calories it had.  Then, add up your calories. Keep your food log near you, such as in a small notebook in your pocket. Another option is to use a mobile app or website. Some programs will calculate  calories for you and show you how many calories you have left each time you add an item to the log. WHAT ARE SOME CALORIE COUNTING TIPS?  Use your calories on foods and drinks that will fill you up and not leave you hungry. Some examples of this include foods like nuts and nut butters, vegetables, lean proteins, and high-fiber foods (more than 5 g fiber per serving).  Eat nutritious foods and avoid empty calories. Empty calories are calories you get from foods or beverages that do not have many nutrients, such as candy and soda. It is better to have a nutritious high-calorie food (such as an avocado) than a food with few nutrients (such as a bag of chips).  Know how many calories are in the foods you eat most often. This way, you do not have to look up how many calories they have each time you eat them.  Look out for foods that may seem like low-calorie foods but are really high-calorie foods, such as baked goods, soda, and fat-free candy.  Pay attention to calories in drinks. Drinks such as sodas, specialty coffee drinks, alcohol, and juices have a lot of calories yet do not fill you up. Choose low-calorie drinks like  water and diet drinks.  Focus your calorie counting efforts on higher calorie items. Logging the calories in a garden salad that contains only vegetables is less important than calculating the calories in a milk shake.  Find a way of tracking calories that works for you. Get creative. Most people who are successful find ways to keep track of how much they eat in a day, even if they do not count every calorie. WHAT ARE SOME PORTION CONTROL TIPS?  Know how many calories are in a serving. This will help you know how many servings of a certain food you can have.  Use a measuring cup to measure serving sizes. This is helpful when you start out. With time, you will be able to estimate serving sizes for some foods.  Take some time to put servings of different foods on your favorite plates, bowls, and cups so you know what a serving looks like.  Try not to eat straight from a bag or box. Doing this can lead to overeating. Put the amount you would like to eat in a cup or on a plate to make sure you are eating the right portion.  Use smaller plates, glasses, and bowls to prevent overeating. This is a quick and easy way to practice portion control. If your plate is smaller, less food can fit on it.  Try not to multitask while eating, such as watching TV or using your computer. If it is time to eat, sit down at a table and enjoy your food. Doing this will help you to start recognizing when you are full. It will also make you more aware of what and how much you are eating. HOW CAN I CALORIE COUNT WHEN EATING OUT?  Ask for smaller portion sizes or child-sized portions.  Consider sharing an entree and sides instead of getting your own entree.  If you get your own entree, eat only half. Ask for a box at the beginning of your meal and put the rest of your entree in it so you are not tempted to eat it.  Look for the calories on the menu. If calories are listed, choose the lower calorie options.  Choose  dishes that include vegetables, fruits, whole grains, low-fat dairy products, and lean  protein. Focusing on smart food choices from each of the 5 food groups can help you stay on track at restaurants.  Choose items that are boiled, broiled, grilled, or steamed.  Choose water, milk, unsweetened iced tea, or other drinks without added sugars. If you want an alcoholic beverage, choose a lower calorie option. For example, a regular margarita can have up to 700 calories and a glass of wine has around 150.  Stay away from items that are buttered, battered, fried, or served with cream sauce. Items labeled "crispy" are usually fried, unless stated otherwise.  Ask for dressings, sauces, and syrups on the side. These are usually very high in calories, so do not eat much of them.  Watch out for salads. Many people think salads are a healthy option, but this is often not the case. Many salads come with bacon, fried chicken, lots of cheese, fried chips, and dressing. All of these items have a lot of calories. If you want a salad, choose a garden salad and ask for grilled meats or steak. Ask for the dressing on the side, or ask for olive oil and vinegar or lemon to use as dressing.  Estimate how many servings of a food you are given. For example, a serving of cooked rice is  cup or about the size of half a tennis ball or one cupcake wrapper. Knowing serving sizes will help you be aware of how much food you are eating at restaurants. The list below tells you how big or small some common portion sizes are based on everyday objects.  1 oz--4 stacked dice.  3 oz--1 deck of cards.  1 tsp--1 dice.  1 Tbsp-- a Ping-Pong ball.  2 Tbsp--1 Ping-Pong ball.   cup--1 tennis ball or 1 cupcake wrapper.  1 cup--1 baseball. Document Released: 10/07/2005 Document Revised: 02/21/2014 Document Reviewed: 08/12/2013 [facility] Patient Information 2015 [place], [place]. This information is not intended to replace advice  given to you by your health care provider. Make sure you discuss any questions you have with your health care provider.

## 2015-06-10 LAB — HEMOGLOBIN A1C
Hgb A1c MFr Bld: 6.8 % — ABNORMAL HIGH (ref <5.7)
MEAN PLASMA GLUCOSE: 148 mg/dL — AB (ref <117)

## 2015-06-10 LAB — TSH: TSH: 1.929 u[IU]/mL (ref 0.350–4.500)

## 2015-06-12 MED ORDER — FLUOXETINE HCL 20 MG PO TABS: 20.0000 mg | ORAL_TABLET | Freq: Every day | ORAL | Status: DC

## 2015-06-12 NOTE — Addendum Note (Signed)
Addended by: Deirdre Pippins on: 06/12/2015 01:27 PM   Modules accepted: Orders

## 2015-06-14 ENCOUNTER — Ambulatory Visit (INDEPENDENT_AMBULATORY_CARE_PROVIDER_SITE_OTHER): Payer: Self-pay | Admitting: Osteopathic Medicine

## 2015-06-14 ENCOUNTER — Encounter: Payer: Self-pay | Admitting: Osteopathic Medicine

## 2015-06-14 VITALS — BP 135/85 | HR 85 | Wt 232.0 lb

## 2015-06-14 DIAGNOSIS — R748 Abnormal levels of other serum enzymes: Secondary | ICD-10-CM

## 2015-06-14 DIAGNOSIS — E669 Obesity, unspecified: Secondary | ICD-10-CM

## 2015-06-14 DIAGNOSIS — F330 Major depressive disorder, recurrent, mild: Secondary | ICD-10-CM

## 2015-06-14 DIAGNOSIS — R5382 Chronic fatigue, unspecified: Secondary | ICD-10-CM

## 2015-06-14 DIAGNOSIS — E781 Pure hyperglyceridemia: Secondary | ICD-10-CM

## 2015-06-14 DIAGNOSIS — E785 Hyperlipidemia, unspecified: Secondary | ICD-10-CM

## 2015-06-14 DIAGNOSIS — E119 Type 2 diabetes mellitus without complications: Secondary | ICD-10-CM

## 2015-06-14 NOTE — Progress Notes (Signed)
HPI: Jaime Morrow is a 39 y.o. female who presents to [facility]  today for CC: DISCUSS LABS, RECHECK DEPRESSION  HYPERLIPIDEMIA: Cholesterol high, TG very high - worse DIABETES: A1C elevated compared to previous - worse DEPRESSION: No change, has not filled the Fluoxetine yet but this was recently changed to capsules which is cheaper for patient. No thoughts of hurting self/others.   Past medical, social and family history reviewed: no change Past Medical History  Diagnosis Date  . Anemia   . Asthma   . Blood transfusion abn reaction or complication, no procedure mishap   . 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 Gestational diabetes     insulin and metformin  . Preexisting diabetes complicating pregnancy, antepartum 11/30/2012  . Ruptured lumbar disc   . Type II diabetes mellitus    Past Surgical History  Procedure Laterality Date  . Dilation and curettage of uterus  2005 x 2  . Lumbar laminectomy  2007  . Lumbar disc surgery     Social History  Substance Use Topics  . Smoking status: Former Smoker -- 3.00 packs/day for 5 years  . Smokeless tobacco: Never Used  . Alcohol Use: No   Family History  Problem Relation Age of Onset  . COPD Maternal Grandmother   . Diabetes type II Maternal Grandmother   . Rheum arthritis Maternal Grandmother   . Hyperlipidemia Maternal Grandmother   . Diabetes Maternal Grandmother   . Cancer Maternal Grandfather     colon  . Alcohol abuse Maternal Grandfather   . Hyperlipidemia Maternal Grandfather   . Alcohol abuse Paternal Grandfather   . Hyperlipidemia Mother   . Thyroid disease Mother   . Hyperlipidemia Father   . Hypertension Father   . Thyroid disease Father   . Diabetes Father   . Alcohol abuse Brother   . Cancer Maternal Aunt     skin  . Heart attack Maternal Uncle       Review of Systems: CONSTITUTIONAL: Neg fever/chills, no unintentional weight changes, (+)  fatigue HEAD/EYES/EARS/NOSE: No headache/vision change or hearing change CARDIAC: No chest pain/pressure/palpitations, no orthopnea RESPIRATORY: No cough/shortness of breath/wheeze GASTROINTESTINAL: No nausea/vomiting/abdominal pain/blood in stool/diarrhea/constipation PSYCHIATRIC: (+) depression, no change from previous visit 06/09/15  Exam:  BP 148/96 mmHg  Pulse 85  Wt 232 lb (105.235 kg)  SpO2  Constitutional: VSS, see above. General Appearance: alert, well-developed, well-nourished, NAD Respiratory: Normal respiratory effort. No dullness/hyper-resonance to percussion. Breath sounds normal, no wheeze/rhonchi/rales Cardiovascular: S1/S2 normal, RRR no murmur/rub/gallop auscultated.  Psychiatric: Normal judgment/insight. Normal mood and affect. Oriented x3.     ASSESSMENT/PLAN:  Type 2 diabetes mellitus without complication - Advised consistent use of metformin, patient is currently up to 1000 mg twice a day having no problems. We'll recheck A1c in 3 months  Chronic fatigue - Diet and exercise modifications discussed to optimize overall health and well-being, last not show any concerning secondary causes of fatigue  Major depressive disorder, recurrent episode, mild - Patient advised to pick up the fluoxetine and start this as soon as possible. She is advised that it may take 6-8 weeks to fully stabilized and show greatest benefit. She is advised that she can go from 20 mg daily to 40 mg daily if she is tolerating this medicine after 1-2 weeks.  Obesity - Diet and exercise modifications discussed to optimize overall health and well-being and to reduce cardiovascular risk factors  Hyperlipidemia - Labs reviewed 06/14/2015, repeat in  3 months. Advised patient should be on a statin given that she is a diabetic, however due to costs she wants to try lifestyle modifications first. If no improvements on recheck in 3 months I will have to insist on some medication  Hypertriglyceridemia - Labs  reviewed 06/14/2015, repeat in 3 months  Elevated liver enzymes - Labs reviewed 06/14/2015, repeat in 3 months, enzymes are not higher than 2 times upper normal limit, we'll monitor these, patient reports recent negative hepatitis testing in pregnancy but not sure about testing for hepatitis C, advised that if labs are persistently elevated or higher than we may need to do further workup. She would like to defer this for the moment due to cost   Lab results reviewed with the patient, all questions answered, advised repeat labs in 3 months to monitor the abnormalities, advised patient that if labs indicate worsening problems may need to do further investigation. Patient does not have insurance and would like to minimize cost by avoiding workup at this time, I am amenable to this plan in shared decision-making with the patient

## 2015-06-28 ENCOUNTER — Telehealth: Payer: Self-pay | Admitting: Osteopathic Medicine

## 2015-06-28 DIAGNOSIS — F32A Depression, unspecified: Secondary | ICD-10-CM

## 2015-06-28 DIAGNOSIS — F329 Major depressive disorder, single episode, unspecified: Secondary | ICD-10-CM

## 2015-06-28 MED ORDER — FLUOXETINE HCL 40 MG PO CAPS: 40.0000 mg | ORAL_CAPSULE | Freq: Every day | ORAL | Status: DC

## 2015-06-28 NOTE — Telephone Encounter (Signed)
Fax from pharmacy - pt up to  Prozac and doing well, requests rx for this instead of  pills. Sent.

## 2015-07-19 ENCOUNTER — Encounter: Payer: Self-pay | Admitting: Osteopathic Medicine

## 2015-09-18 ENCOUNTER — Ambulatory Visit (INDEPENDENT_AMBULATORY_CARE_PROVIDER_SITE_OTHER): Payer: Self-pay | Admitting: Osteopathic Medicine

## 2015-09-18 ENCOUNTER — Encounter: Payer: Self-pay | Admitting: Osteopathic Medicine

## 2015-09-18 VITALS — BP 129/97 | HR 98 | Ht 65.0 in | Wt 228.0 lb

## 2015-09-18 DIAGNOSIS — R748 Abnormal levels of other serum enzymes: Secondary | ICD-10-CM | POA: Insufficient documentation

## 2015-09-18 DIAGNOSIS — E119 Type 2 diabetes mellitus without complications: Secondary | ICD-10-CM

## 2015-09-18 DIAGNOSIS — F329 Major depressive disorder, single episode, unspecified: Secondary | ICD-10-CM

## 2015-09-18 DIAGNOSIS — E781 Pure hyperglyceridemia: Secondary | ICD-10-CM

## 2015-09-18 DIAGNOSIS — E785 Hyperlipidemia, unspecified: Secondary | ICD-10-CM | POA: Insufficient documentation

## 2015-09-18 DIAGNOSIS — F32A Depression, unspecified: Secondary | ICD-10-CM

## 2015-09-18 LAB — POCT GLYCOSYLATED HEMOGLOBIN (HGB A1C): Hemoglobin A1C: 6.1

## 2015-09-18 NOTE — Progress Notes (Signed)
HPI: Jaime BlueJennifer Morrow is a 39 y.o. female who [facility] [place] for chief complaint of: No chief complaint on file.   DIABETES SCREENING/PREVENTIVE CARE: A1C past 3-6 mos: Yes  controlled? Yes  BP goal <140/90: Yes  LDL goal <70: No  Eye exam annually: No , importance discussed with patient Foot exam: Yes  Microalbuminuria: declined due to cost Metformin: Yes  ACE/ARB: BP normal Antiplatelet if ASCVD Risk >10%: No  Statin: declined due to cost.   Diabetes: Patient started on metformin at last visit, A1c rechecked today at 6.1 which is improved, see above for preventive care, cost is an issue for this patient given lack of insurance, measures she has declined due to this are noted above  Hyperlipidemia: Advised patient at last visit she needs a statin, she declined due to cost, we'll repeat lipids today  Depression/Weight gain: Prozac caused weight gain, binging was still a problem. Went off prozac and is now on phentermine for weight loss clinic, doing well on this medication, records indicate weight loss since she was last seen 3 months ago. Reports overall that her mood is better, declines trial of different antidepressant medication    Past medical, social and family history reviewed: Past Medical History  Diagnosis Date  . Anemia   . Asthma   . Blood transfusion abn reaction or complication, no procedure mishap   . 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 Gestational diabetes     insulin and metformin  . Preexisting diabetes complicating pregnancy, antepartum 11/30/2012  . Ruptured lumbar disc   . Type II diabetes mellitus    Past Surgical History  Procedure Laterality Date  . Dilation and curettage of uterus  2005 x 2  . Lumbar laminectomy  2007  . Lumbar disc surgery     Social History  Substance Use Topics  . Smoking status: Former Smoker -- 3.00 packs/day for 5 years  . Smokeless tobacco:  Never Used  . Alcohol Use: No   Family History  Problem Relation Age of Onset  . COPD Maternal Grandmother   . Diabetes type II Maternal Grandmother   . Rheum arthritis Maternal Grandmother   . Hyperlipidemia Maternal Grandmother   . Diabetes Maternal Grandmother   . Cancer Maternal Grandfather     colon  . Alcohol abuse Maternal Grandfather   . Hyperlipidemia Maternal Grandfather   . Alcohol abuse Paternal Grandfather   . Hyperlipidemia Mother   . Thyroid disease Mother   . Hyperlipidemia Father   . Hypertension Father   . Thyroid disease Father   . Diabetes Father   . Alcohol abuse Brother   . Cancer Maternal Aunt     skin  . Heart attack Maternal Uncle    Current Outpatient Prescriptions on File Prior to Visit  Medication Sig Dispense Refill  . albuterol (PROVENTIL HFA;VENTOLIN HFA) 108 (90 BASE) MCG/ACT inhaler Inhale 2 puffs into the lungs every 6 (six) hours as needed. For wheezing or shortness of breath    . metFORMIN (GLUCOPHAGE) 1000 MG tablet Take 1 tablet (1,000 mg total) by mouth 2 (two) times daily  6  0 tablet 6  . Multiple Vitamin (MULTIVITAMIN) tablet Take 1 tablet by mouth daily.    Phentermine 37.5 mg 1 tab by mouth daily    No Known Allergies    Review of Systems: CONSTITUTIONAL:  No  fever, no chills, No  unintentional weight changes CARDIAC: No chest pain,  no pressure/palpitations, no orthopnea RESPIRATORY: No  cough, No  shortness of breath/wheeze GASTROINTESTINAL: No nausea, no vomiting, no abdominal pain MUSCULOSKELETAL: No  myalgia/arthralgia ENDOCRINE: No polyuria/polydipsia/polyphagia, no heat/cold intolerance  PSYCHIATRIC: No concerns with depression, no concerns with anxiety, no sleep problems    Exam:  BP 129/97 mmHg  Pulse 98  Ht  (1.651 m)  Wt 228 lb (103.42 kg)  BMI 37.94 kg/m2 Constitutional: VSS, see above. General Appearance: alert, well-developed, well-nourished, NAD Eyes: Normal lids and conjunctive, non-icteric  sclera,  Ears, Nose, Mouth, Throat: Normal external inspection ears/nares/mouth/lips/gums, MMM Neck: No masses, trachea midline. No thyroid enlargement/tenderness/mass appreciated. No lymphadenopathy Respiratory: Normal respiratory effort. no wheeze, no rhonchi, no rales Cardiovascular: S1/S2 normal, no murmur, no rub/gallop auscultated. RRR.   Hemoglobin A1c 6.1 today  Other results reviewed: A1c 3 months ago was 6.8, mildly elevated AST and ALT, Significantly elevated cholesterol with hypertriglyceridemia, low HDL, LDL unable to be calculated. 4 pounds weight loss in 3 months   ASSESSMENT/PLAN: Continue with metformin, phentermine for weight loss clinic, blood pressure is normal. We'll recheck A1c in 6 months. Recheck labs as below, advised patient may need to strongly consider statin medication based on results, she is not on birth control but her husband is going to be undergoing a vasectomy soon.   Type 2 diabetes mellitus without complication, without long-term current use of insulin (HCC) - Plan: POCT HgB A1C  Hyperlipidemia - Plan: Lipid panel  Hypertriglyceridemia - Plan: Lipid panel  Elevated liver enzymes - Plan: COMPLETE METABOLIC PANEL WITH GFR  Depression - hold off on medications for now, patient is not interested in pharmacologic management, she wants to try lifestyle modifications for weight loss since this was the source of most of her depression/mood problems was concerned over weight. We'll follow next visit.   Return in about 6 months (around 03/17/2016), or sooner if any problems arise, for DIABETES FOLLOWUP.

## 2015-09-21 LAB — LIPID PANEL
Cholesterol: 178 mg/dL (ref 125–200)
HDL: 32 mg/dL — AB (ref >=46)
LDL CALC: 80 mg/dL (ref <130)
Total CHOL/HDL Ratio: 5.6 Ratio — ABNORMAL HIGH (ref <=5.0)
Triglycerides: 328 mg/dL — ABNORMAL HIGH (ref <150)
VLDL: 66 mg/dL — AB (ref <30)

## 2015-09-21 LAB — COMPLETE METABOLIC PANEL WITH GFR
ALT: 44 U/L — ABNORMAL HIGH (ref 6–29)
AST: 35 U/L — ABNORMAL HIGH (ref 10–30)
Albumin: 3.8 g/dL (ref 3.6–5.1)
Alkaline Phosphatase: 72 U/L (ref 33–115)
BILIRUBIN TOTAL: 0.4 mg/dL (ref 0.2–1.2)
BUN: 13 mg/dL (ref 7–25)
CHLORIDE: 104 mmol/L (ref 98–110)
CO2: 24 mmol/L (ref 20–31)
Calcium: 9.2 mg/dL (ref 8.6–10.2)
Creat: 0.44 mg/dL — ABNORMAL LOW (ref 0.50–1.10)
GFR, Est African American: >89 mL/min (ref >=60)
Glucose, Bld: 112 mg/dL — ABNORMAL HIGH (ref 65–99)
Potassium: 4.5 mmol/L (ref 3.5–5.3)
SODIUM: 137 mmol/L (ref 135–146)
TOTAL PROTEIN: 7.1 g/dL (ref 6.1–8.1)

## 2015-09-25 ENCOUNTER — Telehealth: Payer: Self-pay | Admitting: Emergency Medicine

## 2015-09-25 NOTE — Telephone Encounter (Signed)
Patient called asking for lab results; I told her "  Notes Recorded by Sunnie NielsenNatalie Alexander, DO on 09/21/2015 at 8:39 AM Please call patient: Liver enzymes are stable, we'll follow these up in another 6 months. Cholesterol is better but still high - guidelines recommended that diabetic patients be on cholesterol medication but if she wants to continue diet/exercise changes and we can monitor levels again in 6 months I am ok with that. If she wants to start meds let me know, I'll call her. Thanks." She responded that she would like to continue with diet/exercise program and have results in 6 months determine course of action. pak

## 2016-01-21 ENCOUNTER — Other Ambulatory Visit: Payer: Self-pay | Admitting: Osteopathic Medicine

## 2016-02-15 ENCOUNTER — Other Ambulatory Visit: Payer: Self-pay | Admitting: Osteopathic Medicine

## 2016-03-19 ENCOUNTER — Encounter: Payer: Self-pay | Admitting: Osteopathic Medicine

## 2016-03-19 ENCOUNTER — Ambulatory Visit (INDEPENDENT_AMBULATORY_CARE_PROVIDER_SITE_OTHER): Payer: Self-pay | Admitting: Osteopathic Medicine

## 2016-03-19 VITALS — BP 135/85 | HR 88 | Ht 65.0 in | Wt 225.0 lb

## 2016-03-19 DIAGNOSIS — F329 Major depressive disorder, single episode, unspecified: Secondary | ICD-10-CM

## 2016-03-19 DIAGNOSIS — R748 Abnormal levels of other serum enzymes: Secondary | ICD-10-CM

## 2016-03-19 DIAGNOSIS — O24414 Gestational diabetes mellitus in pregnancy, insulin controlled: Secondary | ICD-10-CM

## 2016-03-19 DIAGNOSIS — Z920 Personal history of contraception: Secondary | ICD-10-CM

## 2016-03-19 DIAGNOSIS — R632 Polyphagia: Secondary | ICD-10-CM

## 2016-03-19 DIAGNOSIS — R635 Abnormal weight gain: Secondary | ICD-10-CM

## 2016-03-19 DIAGNOSIS — F32A Depression, unspecified: Secondary | ICD-10-CM

## 2016-03-19 DIAGNOSIS — E119 Type 2 diabetes mellitus without complications: Secondary | ICD-10-CM

## 2016-03-19 LAB — POCT GLYCOSYLATED HEMOGLOBIN (HGB A1C): HEMOGLOBIN A1C: 5.6

## 2016-03-19 MED ORDER — BUPROPION HCL ER (XL) 150 MG PO TB24: 150.0000 mg | ORAL_TABLET | Freq: Every day | ORAL | Status: DC

## 2016-03-19 MED ORDER — METFORMIN HCL 1000 MG PO TABS: 1000.0000 mg | ORAL_TABLET | Freq: Two times a day (BID) | ORAL | Status: DC

## 2016-03-19 NOTE — Progress Notes (Signed)
HPI: Jaime Morrow is a 40 y.o. female who presents to [facility]  today for chief complaint of:  Chief Complaint  Patient presents with  . Follow-up    DIABETES SCREENING/PREVENTIVE CARE: A1C past 3-6 mos: Yes  controlled? Yes  BP goal <140/90: Yes  LDL goal <70: No  Eye exam annually: No , importance discussed with patient Foot exam: Yes 05/2015 Microalbuminuria: declined due to cost Metformin: Yes  ACE/ARB: BP normal Antiplatelet if ASCVD Risk >10%: No  Statin: declined due to cost.   Diabetes: Patient started on metformin 05/2015 visit with A1c 6.8, A1c rechecked 08/2015 at 6.1 after starting metformin. See above for preventive care, cost is an issue for this patient given lack of insurance, measures she has declined due to this are noted above.  Hyperlipidemia: Advised patient at last visit she needs a statin, she declined due to cost.    Depression/Weight gain: Prozac caused weight gain, binging was still a problem. Went off prozac and had been on phentermine for weight loss clinic - she was told by this clinic that she shouldn't be on both medicines at the same time (?), was on the Phentermine about 4 weeks and then stopped but had stopped this and is back on the Prozac. Weight watchers now. Struggling with weight loss, has lost approx 15 lbs since 05/2015/   Pap 12/23/14 NILM/(-)HPV  Past medical, social and family history reviewed: Past Medical History  Diagnosis Date  . Anemia   . Asthma   . Blood transfusion abn reaction or complication, no procedure mishap   . 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 Gestational diabetes     insulin and metformin  . Preexisting diabetes complicating pregnancy, antepartum 11/30/2012  . Ruptured lumbar disc   . Type II diabetes mellitus [facility])    Past Surgical History  Procedure Laterality Date  . Dilation and curettage of uterus  2005 x 2  . Lumbar laminectomy  2007  .  Lumbar disc surgery     Social History  Substance Use Topics  . Smoking status: Former Smoker -- 3.00 packs/day for 5 years  . Smokeless tobacco: Never Used  . Alcohol Use: No   Family History  Problem Relation Age of Onset  . COPD Maternal Grandmother   . Diabetes type II Maternal Grandmother   . Rheum arthritis Maternal Grandmother   . Hyperlipidemia Maternal Grandmother   . Diabetes Maternal Grandmother   . Cancer Maternal Grandfather     colon  . Alcohol abuse Maternal Grandfather   . Hyperlipidemia Maternal Grandfather   . Alcohol abuse Paternal Grandfather   . Hyperlipidemia Mother   . Thyroid disease Mother   . Hyperlipidemia Father   . Hypertension Father   . Thyroid disease Father   . Diabetes Father   . Alcohol abuse Brother   . Cancer Maternal Aunt     skin  . Heart attack Maternal Uncle    Current Outpatient Prescriptions on File Prior to Visit  Medication Sig Dispense Refill  . albuterol (PROVENTIL HFA;VENTOLIN HFA) 108 (90 BASE) MCG/ACT inhaler Inhale 2 puffs into the lungs every 6 (six) hours as needed. For wheezing or shortness of breath    . metFORMIN (GLUCOPHAGE) 1000 MG tablet Take 1 tablet (1,000 mg total) by mouth 2 (two) times daily  6  0 tablet 6  . Multiple Vitamin (MULTIVITAMIN) tablet Take 1 tablet by mouth daily.    Phentermine 37.5 mg  1 tab by mouth daily    No Known Allergies    Review of Systems: CONSTITUTIONAL:  No  fever, no chills, No  unintentional weight changes CARDIAC: No chest pain, no pressure/palpitations, no orthopnea RESPIRATORY: No  cough, No  shortness of breath/wheeze GASTROINTESTINAL: No nausea, no vomiting, no abdominal pain MUSCULOSKELETAL: No  myalgia/arthralgia ENDOCRINE: No polyuria/polydipsia/polyphagia, no heat/cold intolerance  PSYCHIATRIC: No concerns with depression, no concerns with anxiety, no sleep problems    Exam:  BP 135/85 mmHg  Pulse 88  Ht 5\' 5"  (1.651 m)  Wt 225 lb (102.059 kg)  BMI 37.44  kg/m2 Constitutional: VSS, see above. General Appearance: alert, well-developed, well-nourished, NAD Eyes: Normal lids and conjunctive, non-icteric sclera,  Ears, Nose, Mouth, Throat: Normal external inspection ears/nares/mouth/lips/gums, MMM Neck: No masses, trachea midline. No thyroid enlargement/tenderness/mass appreciated. No lymphadenopathy Respiratory: Normal respiratory effort. no wheeze, no rhonchi, no rales Cardiovascular: S1/S2 normal, no murmur, no rub/gallop auscultated. RRR.     Results for orders placed or performed in visit on 03/19/16 (from the past 24 hour(s))  POCT glycosylated hemoglobin (Hb A1C)     Status: None   Collection Time: 03/19/16  2:41 PM  Result Value Ref Range   Hemoglobin A1C 5.6      ASSESSMENT/PLAN:   Type 2 diabetes mellitus without complication, without long-term current use of insulin (HCC) - A1c improved on metformin, can recheck in 6 months - Plan: POCT glycosylated hemoglobin (Hb A1C)  Depression (emotion) - Discontinue Prozac due to side effects, transition to Wellbutrin given secondary concern for weight issues from the patient  Binge eating - Plan: buPROPion (WELLBUTRIN XL) 150 MG 24 hr tablet  Weight gain - Plan: buPROPion (WELLBUTRIN XL) 150 MG 24 hr tablet  Elevated liver enzymes - Plan: Hepatic function panel   Return in about 6 months (around 09/19/2016), or sooner if needed, for DIABETES FOLLOW-UP.

## 2016-03-19 NOTE — Patient Instructions (Signed)
If Welbutrin (Bupropion) causing problems - let me know. Give it at least 6 - 8 weeks for noticeable weight loss or mood changes, we can increase the dose at 6 weeks if you are tolerating the medicine but not seeing the results you are hoping for (call the office and let me know if you'd like to refill the 150mg  dose or go up to 300 mg dose and I'll call in the medicine). I encourage weight watchers program, avoiding sugary and processed foods, increasing fiber (vegetable and whole grains) and reducing animal fats (dairy/cheese, red meat), increasing lean protein (dry nuts such as almonds, beans, fish/chicken/turkey).   Diabetes is under great control! Keep on the Metformin.

## 2016-05-29 ENCOUNTER — Other Ambulatory Visit: Payer: Self-pay | Admitting: Osteopathic Medicine

## 2016-05-29 DIAGNOSIS — O24414 Gestational diabetes mellitus in pregnancy, insulin controlled: Secondary | ICD-10-CM

## 2016-05-29 MED ORDER — METFORMIN HCL 1000 MG PO TABS: 1000.0000 mg | ORAL_TABLET | Freq: Two times a day (BID) | ORAL | 6 refills | Status: DC

## 2016-06-29 ENCOUNTER — Other Ambulatory Visit: Payer: Self-pay | Admitting: Osteopathic Medicine

## 2016-06-29 DIAGNOSIS — R632 Polyphagia: Secondary | ICD-10-CM

## 2016-06-29 DIAGNOSIS — R635 Abnormal weight gain: Secondary | ICD-10-CM

## 2016-07-10 ENCOUNTER — Ambulatory Visit (INDEPENDENT_AMBULATORY_CARE_PROVIDER_SITE_OTHER): Payer: Self-pay | Admitting: Osteopathic Medicine

## 2016-07-10 ENCOUNTER — Encounter: Payer: Self-pay | Admitting: Osteopathic Medicine

## 2016-07-10 VITALS — BP 127/68 | HR 90 | Ht 65.0 in | Wt 229.0 lb

## 2016-07-10 DIAGNOSIS — F329 Major depressive disorder, single episode, unspecified: Secondary | ICD-10-CM

## 2016-07-10 DIAGNOSIS — L03011 Cellulitis of right finger: Secondary | ICD-10-CM

## 2016-07-10 DIAGNOSIS — F32A Depression, unspecified: Secondary | ICD-10-CM

## 2016-07-10 DIAGNOSIS — B354 Tinea corporis: Secondary | ICD-10-CM

## 2016-07-10 DIAGNOSIS — E119 Type 2 diabetes mellitus without complications: Secondary | ICD-10-CM

## 2016-07-10 LAB — POCT GLYCOSYLATED HEMOGLOBIN (HGB A1C): HEMOGLOBIN A1C: 6.1

## 2016-07-10 MED ORDER — TERBINAFINE HCL 1 % EX CREA: 1.0000 "application " | TOPICAL_CREAM | Freq: Two times a day (BID) | CUTANEOUS | 0 refills | Status: AC

## 2016-07-10 MED ORDER — BUPROPION HCL ER (XL) 150 MG PO TB24: 300.0000 mg | ORAL_TABLET | Freq: Every day | ORAL | 2 refills | Status: DC

## 2016-07-10 MED ORDER — CEPHALEXIN 500 MG PO CAPS: 500.0000 mg | ORAL_CAPSULE | Freq: Four times a day (QID) | ORAL | 0 refills | Status: DC

## 2016-07-10 NOTE — Progress Notes (Signed)
HPI: Jaime Morrow is a 40 y.o. female  who presents to [facility] [place] today, 07/10/16,  for chief complaint of:  Chief Complaint  Patient presents with  . Rash    RIGHT HAND AND BELLY BUTTON    Right hand: Concerned about nail falling off, infection. She does her own manicures at home, typically uses synthetic fingernails. About 1-1/2 months ago rash started on hand, occasional serous drainage, no drainage, no extension of erythema up the finger past the DIP.  Navel: Red, scaly, irritated. Also going on about 1-1/2 months. Patient is tried no home remedies  Patient also reports Wellbutrin is not working as well for binge eating/depression, would like to know alternative plan for this.  Diabetes: Overdue for A1c recheck, no home glucometer.    Past medical, surgical, social and family history reviewed: Past Medical History:  Diagnosis Date  . 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 Anemia   . Asthma   . Blood transfusion abn reaction or complication, no procedure mishap   . Gestational diabetes    insulin and metformin  . Preexisting diabetes complicating pregnancy, antepartum 11/30/2012  . Ruptured lumbar disc   . Type II diabetes mellitus (HCC)    Past Surgical History:  Procedure Laterality Date  . DILATION AND CURETTAGE OF UTERUS  2005 x 2  . LUMBAR DISC SURGERY    . LUMBAR LAMINECTOMY  2007   Social History  Substance Use Topics  . Smoking status: Former Smoker    Packs/day: 3.00    Years: 5.00  . Smokeless tobacco: Never Used  . Alcohol use No   Family History  Problem Relation Age of Onset  . COPD Maternal Grandmother   . Diabetes type II Maternal Grandmother   . Rheum arthritis Maternal Grandmother   . Hyperlipidemia Maternal Grandmother   . Diabetes Maternal Grandmother   . Cancer Maternal Grandfather     colon  . Alcohol abuse Maternal Grandfather   . Hyperlipidemia Maternal Grandfather   .  Alcohol abuse Paternal Grandfather   . Hyperlipidemia Mother   . Thyroid disease Mother   . Hyperlipidemia Father   . Hypertension Father   . Thyroid disease Father   . Diabetes Father   . Alcohol abuse Brother   . Cancer Maternal Aunt     skin  . Heart attack Maternal Uncle      Current medication list and allergy/intolerance information reviewed:   Current Outpatient Prescriptions  Medication Sig Dispense Refill  . albuterol (PROVENTIL HFA;VENTOLIN HFA) 108 (90 BASE) MCG/ACT inhaler Inhale 2 puffs into the lungs every 6 (six) hours as needed. For wheezing or shortness of breath    . buPROPion (WELLBUTRIN XL) 150 MG 24 hr tablet TAKE 1 TABLET BY MOUTH DAILY 30 tablet 2  . metFORMIN (GLUCOPHAGE) 1000 MG tablet Take 1 tablet (1,000 mg total) by mouth 2 (two) times daily with a meal. 60 tablet 6  . Multiple Vitamin (MULTIVITAMIN) tablet Take 1 tablet by mouth daily.     No current facility-administered medications for this visit.    Facility-Administered Medications Ordered in Other Visits  Medication Dose Route Frequency Provider Last Rate Last Dose  . fentaNYL 2.5 mcg/ml w/bupivacaine 0.1% in NS epidural infusion ([facility])    Continuous PRN Brayton Caves, MD 14 mL/hr at 11/21/14 1031 14 mL/hr at 11/21/14 1031   No Known Allergies    Review of Systems:  Constitutional:  No  fever, no chills,  No recent illness.   HEENT: No  headache, no vision change  Cardiac: No  chest pain  Respiratory:  No  shortness of breath.  Genitourinary:  No abnormal genital discharge  Skin: +Rash, +other wounds/concerning lesions  Endocrine:  No polyuria/polydipsia/polyphagia   Neurologic: No  weakness  Psychiatric: +concerns with depression  Exam:  BP 127/68   Pulse 90   Ht 5\' 5"  (1.651 m)   Wt 229 lb (103.9 kg)   BMI 38.11 kg/m   Constitutional: VS see above. General Appearance: alert, well-developed, well-nourished, NAD  Neck: No masses, trachea midline.   Respiratory:  Normal respiratory effort.  Musculoskeletal: Gait normal. No clubbing/cyanosis of digits.      Skin/Integument: warm, dry. Erythema/paronychia right middle finger. No drainage. Umbilicus positive firm erythematous/scaly rash  Psychiatric: Normal judgment/insight. Normal mood and affect. Oriented x3.    Results for orders placed or performed in visit on 07/10/16 (from the past 72 hour(s))  POCT HgB A1C     Status: None   Collection Time: 07/10/16 11:16 AM  Result Value Ref Range   Hemoglobin A1C 6.1       ASSESSMENT/PLAN:    Paronychia, right - Plan: cephALEXin (KEFLEX) 500 MG capsule  Cellulitis of finger of right hand - Plan: cephALEXin (KEFLEX) 500 MG capsule  Tinea corporis - Plan: terbinafine (LAMISIL) 1 % cream  Type 2 diabetes mellitus without complication, without long-term current use of insulin (HCC) - Plan: POCT HgB A1C  Depression - Plan: buPROPion (WELLBUTRIN XL) 150 MG 24 hr tablet   Patient Instructions  For nail infection: Oral antibiotics, keep nail clean. If symptoms worsen or fail to improve, let us know.  For umbilicus/bellybutton: Suspect fungal infection/yeast infection. Rx written for cream to use, but if this is not making any difference let us know and we will need to transition to oral medication. However, prior to starting oral medication, we will need to obtain blood work to check blood counts and liver function to ensure medication safety, you will be required to get blood drawn prior to prescription of this medication if needed.  For the Wellbutrin: You can increase this to 2 tablets per day, if this is helping please let me know and we can call in 300 mg prescription.    Visit summary with medication list and pertinent instructions was printed for patient to review. All questions at time of visit were answered - patient instructed to contact office with any additional concerns. ER/RTC precautions were reviewed with the patient. Follow-up plan:  Return if symptoms worsen or fail to improve, and in 3 mos for diabetic care and routine labs.

## 2016-07-10 NOTE — Patient Instructions (Addendum)
For nail infection: Oral antibiotics, keep nail clean. If symptoms worsen or fail to improve, let us know.  For umbilicus/bellybutton: Suspect fungal infection/yeast infection. Rx written for cream to use, but if this is not making any difference let us know and we will need to transition to oral medication. However, prior to starting oral medication, we will need to obtain blood work to check blood counts and liver function to ensure medication safety, you will be required to get blood drawn prior to prescription of this medication if needed.  For the Wellbutrin: You can increase this to 2 tablets per day, if this is helping please let me know and we can call in 300 mg prescription.

## 2016-08-16 ENCOUNTER — Emergency Department
Admission: EM | Admit: 2016-08-16 | Discharge: 2016-08-16 | Disposition: A | Discharge status: Home / Self Care | Payer: Self-pay | Source: Home / Self Care | Attending: Family Medicine | Admitting: Family Medicine

## 2016-08-16 ENCOUNTER — Encounter: Payer: Self-pay | Admitting: Emergency Medicine

## 2016-08-16 DIAGNOSIS — I10 Essential (primary) hypertension: Secondary | ICD-10-CM

## 2016-08-16 DIAGNOSIS — R0789 Other chest pain: Secondary | ICD-10-CM

## 2016-08-16 LAB — POCT URINALYSIS DIP (MANUAL ENTRY)
BILIRUBIN UA: NEGATIVE
BILIRUBIN UA: NEGATIVE
GLUCOSE UA: NEGATIVE
Leukocytes, UA: NEGATIVE
Nitrite, UA: NEGATIVE
Protein Ur, POC: NEGATIVE
SPEC GRAV UA: 1.015 (ref 1.005–1.03)
Urobilinogen, UA: 0.2 (ref 0–1)
pH, UA: 5.5 (ref 5–8)

## 2016-08-16 MED ORDER — LISINOPRIL 10 MG PO TABS: 10.0000 mg | ORAL_TABLET | Freq: Every day | ORAL | 0 refills | Status: DC

## 2016-08-16 NOTE — ED Provider Notes (Signed)
[name]    CSN: [id number] Arrival date & time: 08/16/16  1040     History   Chief Complaint Chief Complaint  Patient presents with  . Hypertension    HPI Jaime Morrow is a 40 y.o. female.   Patient reports that she has had increased leg and ankle swelling for about a month.  About two weeks ago she developed blurred vision and went [facility] where she states her BP was measured at 190/117.  She was offered a referral but refused.  Two days ago she felt "shaky" and she experienced anterior chest pain.  Last night she awoke anxious and "jittery," and her BP was elevated.  She took one of her husband's lisinopril 10mg  tabs.  This morning her BP was 137/79.  She denies chest pain at present. Family history of hypertension mother, maternal uncle, father, and paternal uncle.   The history is provided by the patient.  Hypertension  This is a new problem. The current episode started more than 1 week ago. The problem has not changed since onset.Associated symptoms include chest pain. Pertinent negatives include no headaches and no shortness of breath. The symptoms are aggravated by stress. Nothing relieves the symptoms. Treatments tried: lisinopril. The treatment provided significant relief.    Past Medical History:  Diagnosis Date  . 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 Anemia   . Asthma   . Blood transfusion abn reaction or complication, no procedure mishap   . Gestational diabetes    insulin and metformin  . Preexisting diabetes complicating pregnancy, antepartum 11/30/2012  . Ruptured lumbar disc   . Type II diabetes mellitus [facility](HCC)     Patient Active Problem List   Diagnosis Date Noted  . Elevated blood pressure reading 08/21/2016  . Arthralgia of both lower legs 08/21/2016  . HX: contraception 03/19/2016  . Type 2 diabetes mellitus without complication, without long-term current use of insulin (HCC)  09/18/2015  . Hyperlipidemia 09/18/2015  . Hypertriglyceridemia 09/18/2015  . Elevated liver enzymes 09/18/2015  . Chronic fatigue 06/09/2015  . Depression (emotion) 06/09/2015  . Hair changes 06/09/2015  . Pain in symphysis pubis during pregnancy 06/17/2014  . 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 Asthma 09/25/2011    Past Surgical History:  Procedure Laterality Date  . DILATION AND CURETTAGE OF UTERUS  2005 x 2  . LUMBAR DISC SURGERY    . LUMBAR LAMINECTOMY  2007    OB History    Gravida Para Term Preterm AB Living   12 10 9 1 2 10    SAB TAB Ectopic Multiple Live Births   2     0 10       Home Medications    Prior to Admission medications   Medication Sig Start Date End Date Taking? Authorizing Provider  albuterol (PROVENTIL HFA;VENTOLIN HFA) 108 (90 BASE) MCG/ACT inhaler Inhale 2 puffs into the lungs every 6 (six) hours as needed. For wheezing or shortness of breath    Historical Provider, MD  buPROPion (WELLBUTRIN XL) 150 MG 24 hr tablet Take 2 tablets (300 mg total) by mouth daily. 07/10/16   Sunnie NielsenNatalie Alexander, DO  lisinopril (PRINIVIL,ZESTRIL) 10 MG tablet Take 1 tablet (10 mg total) by mouth daily. for blood pressure 08/21/16   Sunnie NielsenNatalie Alexander, DO  metFORMIN (GLUCOPHAGE) 1000 MG tablet Take 1 tablet (1,000 mg total) by mouth 2 (two) times daily with a meal. 05/29/16   Sunnie NielsenNatalie Alexander, DO  Multiple Vitamin (  MULTIVITAMIN) tablet Take 1 tablet by mouth daily.    Historical Provider, MD  terbinafine (LAMISIL) 1 % cream Apply 1 application topically 2 (two) times daily. To umbilicus (belly button). If no improvement in 2 weeks, please call the office. Complete resolution can take 4 - 6 weeks. 07/10/16   Sunnie Nielsen, DO    Family History Family History  Problem Relation Age of Onset  . COPD Maternal Grandmother   . Diabetes type II Maternal Grandmother   . Rheum arthritis Maternal Grandmother   . Hyperlipidemia Maternal Grandmother   . Diabetes Maternal  Grandmother   . Cancer Maternal Grandfather     colon  . Alcohol abuse Maternal Grandfather   . Hyperlipidemia Maternal Grandfather   . Alcohol abuse Paternal Grandfather   . Hyperlipidemia Mother   . Thyroid disease Mother   . Hyperlipidemia Father   . Hypertension Father   . Thyroid disease Father   . Diabetes Father   . Alcohol abuse Brother   . Cancer Maternal Aunt     skin  . Heart attack Maternal Uncle     Social History Social History  Substance Use Topics  . Smoking status: Former Smoker    Packs/day: 3.00    Years: 5.00  . Smokeless tobacco: Never Used  . Alcohol use No     Allergies   Patient has no known allergies.   Review of Systems Review of Systems  Constitutional: Negative for activity change, chills, diaphoresis, fatigue and fever.  Eyes: Negative for visual disturbance.  Respiratory: Negative for shortness of breath.   Cardiovascular: Positive for chest pain.  Gastrointestinal: Negative.   Musculoskeletal: Negative.   Skin: Negative.   Neurological: Negative for headaches.  All other systems reviewed and are negative.    Physical Exam Triage Vital Signs ED Triage Vitals  Enc Vitals Group     BP 08/16/16 1120 137/79     Pulse Rate 08/16/16 1120 85     Resp --      Temp 08/16/16 1120 97.9 F (36.6 C)     Temp Source 08/16/16 1120 Oral     SpO2 08/16/16 1120 98 %     Weight 08/16/16 1121 230 lb (104.3 kg)     Height 08/16/16 1121 5\' 5"  (1.651 m)     Head Circumference --      Peak Flow --      Pain Score 08/16/16 1123 0     Pain Loc --      Pain Edu? --      Excl. in GC? --    No data found.   Updated Vital Signs BP 137/79 (BP Location: Left Arm)   Pulse 85   Temp 97.9 F (36.6 C) (Oral)   Ht 5\' 5"  (1.651 m)   Wt 230 lb (104.3 kg)   SpO2 98%   BMI 38.27 kg/m   Visual Acuity Right Eye Distance:   Left Eye Distance:   Bilateral Distance:    Right Eye Near:   Left Eye Near:    Bilateral Near:     Physical Exam    Constitutional: She appears well-developed and well-nourished. No distress.  HENT:  Head: Normocephalic.  Right Ear: External ear normal.  Left Ear: External ear normal.  Nose: Nose normal.  Mouth/Throat: Oropharynx is clear and moist.  Eyes: Conjunctivae are normal. Pupils are equal, round, and reactive to light.  Neck: Neck supple. No thyromegaly present.  Cardiovascular: Normal heart sounds.   Pulmonary/Chest: Breath sounds  normal.    There is tenderness to palpation anterior chest and sternum as noted on diagram.   Abdominal: There is no tenderness.  Musculoskeletal: She exhibits no edema.  Lymphadenopathy:    She has no cervical adenopathy.  Neurological: She is alert.  Skin: Skin is warm and dry.  Nursing note and vitals reviewed.    UC Treatments / Results  Labs (all labs ordered are listed, but only abnormal results are displayed) Labs Reviewed  COMPLETE METABOLIC PANEL WITH GFR - Abnormal; Notable for the following:       Result Value   Sodium 134 (*)    Glucose, Bld 104 (*)    ALT 30 (*)    All other components within normal limits   Narrative:    Performed at:  [facility]                [address], [address]                [place], [place] [address]  POCT URINALYSIS DIP (MANUAL ENTRY) - Abnormal; Notable for the following:    Blood, UA trace-lysed (*)    All other components within normal limits    EKG  EKG Interpretation   Rate:  85 BPM PR:  132 msec QT:  384 msec QTcH:  456 msec QRSD:  92 msec QRS axis:  55 degrees Interpretation:  within normal limits; Normal sinus rhythm        Radiology No results found.  Procedures Procedures (including critical care time)  Medications Ordered in UC Medications - No data to display   Initial Impression / Assessment and Plan / UC Course  I have reviewed the triage vital signs and the nursing notes.  Pertinent labs & imaging results that were available during my care of the patient  were reviewed by me and considered in my medical decision making (see chart for details).  Clinical Course   Normal EKG reassuring; suspect costochondritis. Begin Lisinopril 10mg  daily. Check renal function:  CMP and urinalysis. Measure blood pressure at different times of day and record on calendar prior to your next doctor visit. Followup with PCP as scheduled in 3 days.    Final Clinical Impressions(s) / UC Diagnoses   Final diagnoses:  Other chest pain  Essential hypertension    New Prescriptions Discharge Medication List as of 08/16/2016 12:38 PM    START taking these medications   Details  lisinopril (PRINIVIL,ZESTRIL) 10 MG tablet Take 1 tablet (10 mg total) by mouth daily. for blood pressure, Starting Fri 08/16/2016, Print         Lattie Haw, MD 08/25/16 1538

## 2016-08-16 NOTE — ED Triage Notes (Signed)
2-3 weeks ago legs and ankles started swelling, blurred vision, [facility] BP 190/117, She refused the referral they suggested. 2 days ago chest pains, upper back pain, swollen knees, can't sleep, anxious, B/P 160/112. Has appt with Dr Lyn HollingsheadAlexander on Monday, but doesn't think she can wait. Took one of her husband's Lisinopril last night. B/P normal today 137/79, denies Chest pain or edema.

## 2016-08-16 NOTE — Discharge Instructions (Signed)
Measure blood pressure at different times of day and record on calendar prior to your next doctor visit.

## 2016-08-17 LAB — COMPLETE METABOLIC PANEL WITH GFR
ALBUMIN: 3.9 g/dL (ref 3.6–5.1)
ALK PHOS: 72 U/L (ref 33–115)
ALT: 30 U/L — ABNORMAL HIGH (ref 6–29)
AST: 29 U/L (ref 10–30)
BUN: 10 mg/dL (ref 7–25)
CO2: 24 mmol/L (ref 20–31)
Calcium: 9.2 mg/dL (ref 8.6–10.2)
Chloride: 100 mmol/L (ref 98–110)
Creat: 0.56 mg/dL (ref 0.50–1.10)
GFR, Est African American: >89 mL/min (ref >=60)
GFR, Est Non African American: >89 mL/min (ref >=60)
GLUCOSE: 104 mg/dL — AB (ref 65–99)
Potassium: 4.3 mmol/L (ref 3.5–5.3)
SODIUM: 134 mmol/L — AB (ref 135–146)
Total Bilirubin: 0.5 mg/dL (ref 0.2–1.2)
Total Protein: 7.2 g/dL (ref 6.1–8.1)

## 2016-08-19 ENCOUNTER — Ambulatory Visit: Payer: Self-pay | Admitting: Osteopathic Medicine

## 2016-08-21 ENCOUNTER — Encounter: Payer: Self-pay | Admitting: Osteopathic Medicine

## 2016-08-21 ENCOUNTER — Ambulatory Visit (INDEPENDENT_AMBULATORY_CARE_PROVIDER_SITE_OTHER): Payer: Self-pay | Admitting: Osteopathic Medicine

## 2016-08-21 VITALS — BP 125/57 | HR 90 | Ht 65.0 in | Wt 236.0 lb

## 2016-08-21 DIAGNOSIS — R5382 Chronic fatigue, unspecified: Secondary | ICD-10-CM

## 2016-08-21 DIAGNOSIS — E119 Type 2 diabetes mellitus without complications: Secondary | ICD-10-CM

## 2016-08-21 DIAGNOSIS — Z0189 Encounter for other specified special examinations: Secondary | ICD-10-CM

## 2016-08-21 DIAGNOSIS — R635 Abnormal weight gain: Secondary | ICD-10-CM

## 2016-08-21 DIAGNOSIS — R030 Elevated blood-pressure reading, without diagnosis of hypertension: Secondary | ICD-10-CM

## 2016-08-21 DIAGNOSIS — M25562 Pain in left knee: Secondary | ICD-10-CM

## 2016-08-21 DIAGNOSIS — M25561 Pain in right knee: Secondary | ICD-10-CM

## 2016-08-21 LAB — POCT RAPID STREP A (OFFICE): Rapid Strep A Screen: NEGATIVE

## 2016-08-21 MED ORDER — LISINOPRIL 10 MG PO TABS: 10.0000 mg | ORAL_TABLET | Freq: Every day | ORAL | 2 refills | Status: DC

## 2016-08-21 NOTE — Patient Instructions (Addendum)
Your test was negative for strep. If you develop a sore throat, fever, or other concerning symptoms, we can recheck. Fatigue and joint pain can certainly be due to other causes, including arthritis, depression, viral illness if you are coming down with something. Try taking anti-inflammatory with ibuprofen or Tylenol as needed for aches and pains. We are also getting blood work today to check for some other causes.  I'm not convinced that you need to be on a blood pressure medicine. We need to get your home blood pressure cuff verified in the office, if it is inaccurate, we will need to discontinue the blood pressure medications. If it is accurate, we need to ensure that you're taking her blood pressure at home properly, see sections below. Your blood pressures have always been fine in the office and I do not want you to be on blood pressure medications unnecessarily.  For weight gain/fatigue, important to get on good diet and exercise regimen for overall health as well as for weight loss If you have any questions about healthy diet or exercise, please don't hesitate to contact me.    How to Take Your Blood Pressure HOW DO I GET A BLOOD PRESSURE MACHINE?  You can buy an electronic home blood pressure machine at your local pharmacy. Insurance will sometimes cover the cost if you have a prescription.  Ask your doctor what type of machine is best for you. There are different machines for your arm and your wrist.  If you decide to buy a machine to check your blood pressure on your arm, first check the size of your arm so you can buy the right size cuff. To check the size of your arm:   Use a measuring tape that shows both inches and centimeters.   Wrap the measuring tape around the upper-middle part of your arm. You may need someone to help you measure.   Write down your arm measurement in both inches and centimeters.   To measure your blood pressure correctly, it is important to have the  right size cuff.   If your arm is up to 13 inches (up to 34 centimeters), get an adult cuff size.  If your arm is 13 to 17 inches (35 to 44 centimeters), get a large adult cuff size.    If your arm is 17 to 20 inches (45 to 52 centimeters), get an adult thigh cuff.  WHAT DO THE NUMBERS MEAN?   There are two numbers that make up your blood pressure. For example: 120/80.  The first number (120 in our example) is called the "systolic pressure." It is a measure of the pressure in your blood vessels when your heart is pumping blood.  The second number (80 in our example) is called the "diastolic pressure." It is a measure of the pressure in your blood vessels when your heart is resting between beats.  Your doctor will tell you what your blood pressure should be. WHAT SHOULD I DO BEFORE I CHECK MY BLOOD PRESSURE?   Try to rest or relax for at least 30 minutes before you check your blood pressure.  Do not smoke.  Do not have any drinks with caffeine, such as:  Soda.  Coffee.  Tea.  Check your blood pressure in a quiet room.  Sit down and stretch out your arm on a table. Keep your arm at about the level of your heart. Let your arm relax.  Make sure that your legs are not crossed. HOW DO  I CHECK MY BLOOD PRESSURE?  Follow the directions that came with your machine.  Make sure you remove any tight-fitting clothing from your arm or wrist. Wrap the cuff around your upper arm or wrist. You should be able to fit a finger between the cuff and your arm. If you cannot fit a finger between the cuff and your arm, it is too tight and should be removed and rewrapped.  Some units require you to manually pump up the arm cuff.  Automatic units inflate the cuff when you press a button.  Cuff deflation is automatic in both models.  After the cuff is inflated, the unit measures your blood pressure and pulse. The readings are shown on a monitor. Hold still and breathe normally while the cuff is  inflated.  Getting a reading takes less than a minute.  Some models store readings in a memory. Some provide a printout of readings. If your machine does not store your readings, keep a written record.  Take readings with you to your next visit with your doctor.   This information is not intended to replace advice given to you by your health care provider. Make sure you discuss any questions you have with your health care provider.   Document Released: 09/19/2008 Document Revised: 10/28/2014 Document Reviewed: 12/02/2013 Elsevier Interactive Patient Education [employer]2016 [employer]

## 2016-08-21 NOTE — Progress Notes (Signed)
HPI: Jaime Morrow is a 40 y.o. female  who [facility] today, 08/21/16,  for chief complaint of:  Chief Complaint  Patient presents with  . Follow-up    Seen in Urgent care for High Blood pressure    Concerns for hypertension: Was in ER 08/07/16 for vision changes, diagonsed as migraine. BP cuff at home 160s/100s - never verified in office, this is her husband's cuff. Took husband's lisinopril and went to urgent care the next morning and Rx was given there for Lisinopril.   Musculoskeletal: Patient complains of intermittent achy joints, knees, ankles are the worst. Has not taken any anti-inflammatory medications when this happens. Thinks it may be due to strep, she has 10 children, all have strep throat right now she says, she is not complaining of a sore throat at this time but once tested since previously tested positive for strep abdomen in clinic when she was having joint pain and thinks this may be happening again  Fatigue/joint pain without unusual rashes, mucus numbering ulceration, intermittent fevers. Patient has friend who recently went on a diet plan, she asks me about this, apparently a doctor's order is required, she does not have much more details at this point about what exactly my role would be in getting this service for her.    Past medical, surgical, social and family history reviewed: Past Medical History:  Diagnosis Date  . 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 Anemia   . Asthma   . Blood transfusion abn reaction or complication, no procedure mishap   . Gestational diabetes    insulin and metformin  . Preexisting diabetes complicating pregnancy, antepartum 11/30/2012  . Ruptured lumbar disc   . Type II diabetes mellitus (HCC)    Past Surgical History:  Procedure Laterality Date  . DILATION AND CURETTAGE OF UTERUS  2005 x 2  . LUMBAR DISC SURGERY    . LUMBAR LAMINECTOMY  2007    Social History  Substance Use Topics  . Smoking status: Former Smoker    Packs/day: 3.00    Years: 5.00  . Smokeless tobacco: Never Used  . Alcohol use No   Family History  Problem Relation Age of Onset  . COPD Maternal Grandmother   . Diabetes type II Maternal Grandmother   . Rheum arthritis Maternal Grandmother   . Hyperlipidemia Maternal Grandmother   . Diabetes Maternal Grandmother   . Cancer Maternal Grandfather     colon  . Alcohol abuse Maternal Grandfather   . Hyperlipidemia Maternal Grandfather   . Alcohol abuse Paternal Grandfather   . Hyperlipidemia Mother   . Thyroid disease Mother   . Hyperlipidemia Father   . Hypertension Father   . Thyroid disease Father   . Diabetes Father   . Alcohol abuse Brother   . Cancer Maternal Aunt     skin  . Heart attack Maternal Uncle      Current medication list and allergy/intolerance information reviewed:   Current Outpatient Prescriptions on File Prior to Visit  Medication Sig Dispense Refill  . albuterol (PROVENTIL HFA;VENTOLIN HFA) 108 (90 BASE) MCG/ACT inhaler Inhale 2 puffs into the lungs every 6 (six) hours as needed. For wheezing or shortness of breath    . buPROPion (WELLBUTRIN XL) 150 MG 24 hr tablet Take 2 tablets (300 mg total) by mouth daily. 30 tablet 2  . lisinopril (PRINIVIL,ZESTRIL) 10 MG tablet Take 1 tablet (10 mg total) by mouth daily.  for blood pressure 30 tablet 0  . metFORMIN (GLUCOPHAGE) 1000 MG tablet Take 1 tablet (1,000 mg total) by mouth 2 (two) times daily with a meal. 60 tablet 6  . Multiple Vitamin (MULTIVITAMIN) tablet Take 1 tablet by mouth daily.    Marland Kitchen terbinafine (LAMISIL) 1 % cream Apply 1 application topically 2 (two) times daily. To umbilicus (belly button). If no improvement in 2 weeks, please call the office. Complete resolution can take 4 - 6 weeks. 36 g 0   Current Facility-Administered Medications on File Prior to Visit  Medication Dose Route Frequency Provider Last Rate Last Dose   . fentaNYL 2.5 mcg/ml w/bupivacaine 0.1% in NS epidural infusion ([facility])    Continuous PRN Brayton Caves, MD 14 mL/hr at 11/21/14 1031 14 mL/hr at 11/21/14 1031   No Known Allergies    Review of Systems:  Constitutional: +recent illness - cold last week  HEENT: No  headache, no vision change now, no sore throat  Cardiac: No  chest pain though this was present in ER, No  pressure, No palpitations, no orthopnea, occasionally awakes at night with difficulty taking a deep breath.  Respiratory:  No  shortness of breath. No  Cough  Gastrointestinal: No  abdominal pain, no change in bowel habits  Musculoskeletal: No new myalgia/arthralgia  Skin: No new Rash  Hem/Onc: No  easy bruising/bleeding, No  abnormal lumps/bumps  Neurologic: + fatigue and generalized but nonfocal weakness, No  Dizziness  Psychiatric: No  concerns with depression, No  concerns with anxiety  Exam:  BP (!) 125/57   Pulse 90   Ht 5\' 5"  (1.651 m)   Wt 236 lb (107 kg)   BMI 39.27 kg/m   Constitutional: VS see above. General Appearance: alert, well-developed, well-nourished, NAD  Eyes: Normal lids and conjunctive, non-icteric sclera  Ears, Nose, Mouth, Throat: MMM, Normal external inspection ears/nares/mouth/lips/gums. No pharyngeal erythema or exudate.  Neck: No masses, trachea midline.   Respiratory: Normal respiratory effort. no wheeze, no rhonchi, no rales  Cardiovascular: S1/S2 normal, no murmur, no rub/gallop auscultated. RRR. No LE edema, no JVD.   Musculoskeletal: Gait normal. Symmetric and independent movement of all extremities.   Neurological: Normal balance/coordination. No tremor.  Skin: warm, dry, intact.   Psychiatric: Normal judgment/insight. Normal mood and affect. Oriented x3.    Results for orders placed or performed in visit on 08/21/16 (from the past 72 hour(s))  POCT rapid strep A     Status: None   Collection Time: 08/21/16 10:36 AM  Result Value Ref Range    Rapid Strep A Screen Negative Negative      ASSESSMENT/PLAN:   Elevated blood pressure reading - Home cuff verified in office but I think patient may not have been checking appropriately at home. Instructions given for checking at home. Side effects reviewe - Plan: COMPLETE METABOLIC PANEL WITH GFR, TSH  Type 2 diabetes mellitus without complication, without long-term current use of insulin (HCC)  Weight gain  Chronic fatigue - Lifestyle modifications discussed with patient, recent labs showed no concerns, repeat TSH and CMP.  Arthralgia of both lower legs - Think more likely due to weight gain, arthritic change, chronic fatigue/depression syndrome. No injury. Symptoms consistent with arthritic  Patient request for diagnostic testing - Requests strep testing, I advised she does not meet [facility] criteria to need testing and this was not cost to her. Test was negative. - Plan: POCT rapid strep A    Patient Instructions  Your test was negative for  strep. If you develop a sore throat, fever, or other concerning symptoms, we can recheck. Fatigue and joint pain can certainly be due to other causes, including arthritis, depression, viral illness if you are coming down with something. Try taking anti-inflammatory with ibuprofen or Tylenol as needed for aches and pains. We are also getting blood work today to check for some other causes.  I'm not convinced that you need to be on a blood pressure medicine. We need to get your home blood pressure cuff verified in the office, if it is inaccurate, we will need to discontinue the blood pressure medications. If it is accurate, we need to ensure that you're taking her blood pressure at home properly, see sections below. Your blood pressures have always been fine in the office and I do not want you to be on blood pressure medications unnecessarily.  For weight gain/fatigue, important to get on good diet and exercise regimen for overall health as well as for  weight loss If you have any questions about healthy diet or exercise, please don't hesitate to contact me.    How to Take Your Blood Pressure HOW DO I GET A BLOOD PRESSURE MACHINE?  You can buy an electronic home blood pressure machine at your local pharmacy. Insurance will sometimes cover the cost if you have a prescription.  Ask your doctor what type of machine is best for you. There are different machines for your arm and your wrist.  If you decide to buy a machine to check your blood pressure on your arm, first check the size of your arm so you can buy the right size cuff. To check the size of your arm:   Use a measuring tape that shows both inches and centimeters.   Wrap the measuring tape around the upper-middle part of your arm. You may need someone to help you measure.   Write down your arm measurement in both inches and centimeters.   To measure your blood pressure correctly, it is important to have the right size cuff.   If your arm is up to 13 inches (up to 34 centimeters), get an adult cuff size.  If your arm is 13 to 17 inches (35 to 44 centimeters), get a large adult cuff size.    If your arm is 17 to 20 inches (45 to 52 centimeters), get an adult thigh cuff.  WHAT DO THE NUMBERS MEAN?   There are two numbers that make up your blood pressure. For example: 120/80.  The first number (120 in our example) is called the "systolic pressure." It is a measure of the pressure in your blood vessels when your heart is pumping blood.  The second number (80 in our example) is called the "diastolic pressure." It is a measure of the pressure in your blood vessels when your heart is resting between beats.  Your doctor will tell you what your blood pressure should be. WHAT SHOULD I DO BEFORE I CHECK MY BLOOD PRESSURE?   Try to rest or relax for at least 30 minutes before you check your blood pressure.  Do not smoke.  Do not have any drinks with caffeine, such  as:  Soda.  Coffee.  Tea.  Check your blood pressure in a quiet room.  Sit down and stretch out your arm on a table. Keep your arm at about the level of your heart. Let your arm relax.  Make sure that your legs are not crossed. HOW DO I CHECK MY BLOOD  PRESSURE?  Follow the directions that came with your machine.  Make sure you remove any tight-fitting clothing from your arm or wrist. Wrap the cuff around your upper arm or wrist. You should be able to fit a finger between the cuff and your arm. If you cannot fit a finger between the cuff and your arm, it is too tight and should be removed and rewrapped.  Some units require you to manually pump up the arm cuff.  Automatic units inflate the cuff when you press a button.  Cuff deflation is automatic in both models.  After the cuff is inflated, the unit measures your blood pressure and pulse. The readings are shown on a monitor. Hold still and breathe normally while the cuff is inflated.  Getting a reading takes less than a minute.  Some models store readings in a memory. Some provide a printout of readings. If your machine does not store your readings, keep a written record.  Take readings with you to your next visit with your doctor.   This information is not intended to replace advice given to you by your health care provider. Make sure you discuss any questions you have with your health care provider.   Document Released: 09/19/2008 Document Revised: 10/28/2014 Document Reviewed: 12/02/2013 Elsevier Interactive Patient Education 2016 [employer].       Visit summary with medication list and pertinent instructions was printed for patient to review. All questions at time of visit were answered - patient instructed to contact office with any additional concerns. ER/RTC precautions were reviewed with the patient. Follow-up plan: Return in about 3 months (around 11/21/2016) for DIABETIC FOLLOW-UP.  Note: Total time spent 25  minutes, greater than 50% of the visit was spent face-to-face counseling and coordinating care for the following: The primary encounter diagnosis was Elevated blood pressure reading. Diagnoses of Type 2 diabetes mellitus without complication, without long-term current use of insulin (HCC), Weight gain, Chronic fatigue, Arthralgia of both lower legs, and Patient request for diagnostic testing were also pertinent to this visit.Marland Kitchen

## 2016-08-22 LAB — COMPLETE METABOLIC PANEL WITH GFR
ALT: 23 U/L (ref 6–29)
AST: 16 U/L (ref 10–30)
Albumin: 3.6 g/dL (ref 3.6–5.1)
Alkaline Phosphatase: 72 U/L (ref 33–115)
BUN: 9 mg/dL (ref 7–25)
CHLORIDE: 103 mmol/L (ref 98–110)
CO2: 24 mmol/L (ref 20–31)
Calcium: 8.9 mg/dL (ref 8.6–10.2)
Creat: 0.55 mg/dL (ref 0.50–1.10)
GFR, Est African American: >89 mL/min (ref >=60)
GFR, Est Non African American: >89 mL/min (ref >=60)
Glucose, Bld: 194 mg/dL — ABNORMAL HIGH (ref 65–99)
POTASSIUM: 4.2 mmol/L (ref 3.5–5.3)
Sodium: 135 mmol/L (ref 135–146)
Total Bilirubin: 0.3 mg/dL (ref 0.2–1.2)
Total Protein: 6.7 g/dL (ref 6.1–8.1)

## 2016-08-22 LAB — TSH: TSH: 1.55 mIU/L

## 2016-08-23 ENCOUNTER — Encounter: Payer: Self-pay | Admitting: Osteopathic Medicine

## 2016-08-24 ENCOUNTER — Encounter: Payer: Self-pay | Admitting: Osteopathic Medicine

## 2016-08-30 ENCOUNTER — Inpatient Hospital Stay: Payer: Self-pay | Admitting: Osteopathic Medicine

## 2016-09-16 ENCOUNTER — Other Ambulatory Visit: Payer: Self-pay | Admitting: Osteopathic Medicine

## 2016-09-16 DIAGNOSIS — R635 Abnormal weight gain: Secondary | ICD-10-CM

## 2016-09-16 DIAGNOSIS — R632 Polyphagia: Secondary | ICD-10-CM

## 2016-09-17 ENCOUNTER — Telehealth: Payer: Self-pay

## 2016-09-17 NOTE — Telephone Encounter (Signed)
I last progress note it was noted that she should be on Wellbutrin 150 mg 2 tablets per day to equal total dose of 300 mg. She can continue 150 twice a day or I can change prescription to 300 daily. I went ahead and set in the 150 twice a day.

## 2016-09-17 NOTE — Telephone Encounter (Signed)
Dr. Lyn HollingsheadAlexander is it ok to refill this medication. Please see message on signature from Pharmacy. Please advise. Rhonda Cunningham,CMA

## 2016-09-19 ENCOUNTER — Ambulatory Visit: Payer: Self-pay | Admitting: Osteopathic Medicine

## 2017-02-08 ENCOUNTER — Other Ambulatory Visit: Payer: Self-pay | Admitting: Osteopathic Medicine

## 2017-02-08 DIAGNOSIS — O24414 Gestational diabetes mellitus in pregnancy, insulin controlled: Secondary | ICD-10-CM

## 2017-02-10 LAB — HM DIABETES EYE EXAM

## 2017-02-19 ENCOUNTER — Other Ambulatory Visit: Payer: Self-pay | Admitting: Osteopathic Medicine

## 2017-03-04 ENCOUNTER — Encounter: Payer: Self-pay | Admitting: Osteopathic Medicine

## 2017-03-20 ENCOUNTER — Other Ambulatory Visit: Payer: Self-pay | Admitting: Osteopathic Medicine

## 2017-03-20 DIAGNOSIS — O24414 Gestational diabetes mellitus in pregnancy, insulin controlled: Secondary | ICD-10-CM

## 2017-04-04 ENCOUNTER — Other Ambulatory Visit: Payer: Self-pay | Admitting: Osteopathic Medicine

## 2017-04-04 DIAGNOSIS — O24414 Gestational diabetes mellitus in pregnancy, insulin controlled: Secondary | ICD-10-CM

## 2018-02-20 ENCOUNTER — Other Ambulatory Visit: Payer: Self-pay | Admitting: Osteopathic Medicine

## 2018-02-20 DIAGNOSIS — R632 Polyphagia: Secondary | ICD-10-CM

## 2018-02-20 DIAGNOSIS — R635 Abnormal weight gain: Secondary | ICD-10-CM
